# Patient Record
Sex: Male | Born: 1975
Health system: Southern US, Community
[De-identification: ages and names within clinical notes are randomized; demographics above are authoritative.]

## PROBLEM LIST (undated history)

## (undated) DIAGNOSIS — K219 Gastro-esophageal reflux disease without esophagitis: Secondary | ICD-10-CM

## (undated) HISTORY — DX: Gastro-esophageal reflux disease without esophagitis: K21.9

## (undated) HISTORY — PX: MANDIBLE SURGERY: SHX707

---

## 2005-01-31 ENCOUNTER — Emergency Department (HOSPITAL_COMMUNITY): Admission: EM | Admit: 2005-01-31 | Discharge: 2005-01-31 | Payer: Self-pay | Admitting: Emergency Medicine

## 2005-02-01 ENCOUNTER — Ambulatory Visit (HOSPITAL_COMMUNITY): Admission: RE | Admit: 2005-02-01 | Discharge: 2005-02-02 | Payer: Self-pay | Admitting: Otolaryngology

## 2005-03-04 ENCOUNTER — Ambulatory Visit (HOSPITAL_COMMUNITY): Admission: RE | Admit: 2005-03-04 | Discharge: 2005-03-04 | Payer: Self-pay | Admitting: Otolaryngology

## 2005-03-05 ENCOUNTER — Ambulatory Visit (HOSPITAL_BASED_OUTPATIENT_CLINIC_OR_DEPARTMENT_OTHER): Admission: RE | Admit: 2005-03-05 | Discharge: 2005-03-05 | Payer: Self-pay | Admitting: Otolaryngology

## 2015-05-19 DIAGNOSIS — M25561 Pain in right knee: Secondary | ICD-10-CM

## 2015-05-19 HISTORY — DX: Pain in right knee: M25.561

## 2015-06-16 DIAGNOSIS — S83411A Sprain of medial collateral ligament of right knee, initial encounter: Secondary | ICD-10-CM

## 2015-06-16 HISTORY — DX: Sprain of medial collateral ligament of right knee, initial encounter: S83.411A

## 2017-06-07 ENCOUNTER — Emergency Department (HOSPITAL_COMMUNITY): Payer: 59

## 2017-06-07 ENCOUNTER — Encounter (HOSPITAL_COMMUNITY): Payer: Self-pay

## 2017-06-07 DIAGNOSIS — R079 Chest pain, unspecified: Secondary | ICD-10-CM | POA: Insufficient documentation

## 2017-06-07 LAB — CBC
HCT: 41.2 % (ref 39.0–52.0)
Hemoglobin: 14.3 g/dL (ref 13.0–17.0)
MCH: 31.1 pg (ref 26.0–34.0)
MCHC: 34.7 g/dL (ref 30.0–36.0)
MCV: 89.6 fL (ref 78.0–100.0)
Platelets: 175 10*3/uL (ref 150–400)
RBC: 4.6 MIL/uL (ref 4.22–5.81)
RDW: 12.4 % (ref 11.5–15.5)
WBC: 5.3 10*3/uL (ref 4.0–10.5)

## 2017-06-07 LAB — I-STAT TROPONIN, ED: Troponin i, poc: 0 ng/mL (ref 0.00–0.08)

## 2017-06-07 LAB — BASIC METABOLIC PANEL
Anion gap: 7 (ref 5–15)
BUN: 17 mg/dL (ref 6–20)
CO2: 26 mmol/L (ref 22–32)
Calcium: 9.4 mg/dL (ref 8.9–10.3)
Chloride: 103 mmol/L (ref 101–111)
Creatinine, Ser: 1.23 mg/dL (ref 0.61–1.24)
GFR calc Af Amer: >60 mL/min (ref >60)
GFR calc non Af Amer: >60 mL/min (ref >60)
Glucose, Bld: 98 mg/dL (ref 65–99)
Potassium: 4.1 mmol/L (ref 3.5–5.1)
Sodium: 136 mmol/L (ref 135–145)

## 2017-06-07 NOTE — ED Triage Notes (Signed)
Pt reports central chest pain, described as "indigestion" that started 2 days ago associated with weakness and "I just don't feel normal."

## 2017-06-08 ENCOUNTER — Emergency Department (HOSPITAL_COMMUNITY)
Admission: EM | Admit: 2017-06-08 | Discharge: 2017-06-08 | Disposition: A | Discharge status: Home / Self Care | Payer: 59 | Attending: Emergency Medicine | Admitting: Emergency Medicine

## 2017-06-08 DIAGNOSIS — R079 Chest pain, unspecified: Secondary | ICD-10-CM

## 2017-06-08 LAB — I-STAT TROPONIN, ED: Troponin i, poc: 0 ng/mL (ref 0.00–0.08)

## 2017-06-08 MED ORDER — PANTOPRAZOLE SODIUM 20 MG PO TBEC: 20.0000 mg | DELAYED_RELEASE_TABLET | Freq: Every day | ORAL | 2 refills | Status: DC

## 2017-06-08 NOTE — ED Provider Notes (Signed)
[facility]-EMERGENCY DEPT Provider Note   CSN: [id number] Arrival date & time: 06/07/17  2121  By signing my name below, I, [name], attest that this documentation has been prepared under the direction and in the presence of Augie Vane, Canary Brim, *. Electronically Signed: [name], ED Scribe. 06/08/17. 5:34 AM.  History   Chief Complaint Chief Complaint  Patient presents with  . Chest Pain   The history is provided by the patient. No language interpreter was used.    HPI Comments: Chad Bruce is a 41 y.o. male who presents to the Emergency Department complaining of intermittent chest tightness that began a few days ago. He additioally reports feeling "sluggish." He states that he was previously diagnosed with GERD, but has not taken medication because he has not had recent symptoms. The pt has a FHx of MI, and had CABG. The pt denies a h/x of heart problems including MI, CAD, and states that he has "borderline" HLD. He denies a h/x of smoking and DM. The pt denies fever   History reviewed. No pertinent past medical history.  There are no active problems to display for this patient.   History reviewed. No pertinent surgical history.     Home Medications    Prior to Admission medications   Medication Sig Start Date End Date Taking? Authorizing Provider  pantoprazole (PROTONIX) 20 MG tablet Take 1 tablet (20 mg total) by mouth daily. 06/08/17   Gilda Crease, MD    Family History No family history on file.  Social History Social History  Substance Use Topics  . Smoking status: Never Smoker  . Smokeless tobacco: Never Used  . Alcohol use Yes     Allergies   Patient has no allergy information on record.   Review of Systems Review of Systems  Respiratory: Positive for chest tightness.   Cardiovascular: Positive for chest pain.     Physical Exam Updated Vital Signs BP 116/82 (BP Location: Left Arm)   Pulse (!) 52   Temp 98.1 F (36.7  C) (Oral)   Resp 18   SpO2 100%   Physical Exam  Constitutional: He is oriented to person, place, and time. He appears well-developed and well-nourished. No distress.  HENT:  Head: Normocephalic and atraumatic.  Right Ear: Hearing normal.  Left Ear: Hearing normal.  Nose: Nose normal.  Mouth/Throat: Oropharynx is clear and moist and mucous membranes are normal.  Eyes: Conjunctivae and EOM are normal. Pupils are equal, round, and reactive to light.  Neck: Normal range of motion. Neck supple.  Cardiovascular: Regular rhythm, S1 normal and S2 normal.  Exam reveals no gallop and no friction rub.   No murmur heard. Pulmonary/Chest: Effort normal and breath sounds normal. No respiratory distress. He exhibits no tenderness.  Abdominal: Soft. Normal appearance and bowel sounds are normal. There is no hepatosplenomegaly. There is no tenderness. There is no rebound, no guarding, no tenderness at McBurney's point and negative Murphy's sign. No hernia.  Musculoskeletal: Normal range of motion.  Neurological: He is alert and oriented to person, place, and time. He has normal strength. No cranial nerve deficit or sensory deficit. Coordination normal. GCS eye subscore is 4. GCS verbal subscore is 5. GCS motor subscore is 6.  Skin: Skin is warm, dry and intact. No rash noted. No cyanosis.  Psychiatric: He has a normal mood and affect. His speech is normal and behavior is normal. Thought content normal.  Nursing note and vitals reviewed.    ED Treatments /  Results   DIAGNOSTIC STUDIES: Oxygen Saturation is 100% on RA, normal by my interpretation.   COORDINATION OF CARE: 3:32 AM-Discussed next steps with pt. Pt verbalized understanding and is agreeable with the plan.   Labs (all labs ordered are listed, but only abnormal results are displayed) Labs Reviewed  BASIC METABOLIC PANEL  CBC  I-STAT TROPOININ, ED  I-STAT TROPOININ, ED    EKG  EKG Interpretation  Date/Time:  Tuesday June 07 2017 22:06:02 EDT Ventricular Rate:  61 PR Interval:  160 QRS Duration: 86 QT Interval:  378 QTC Calculation: 380 R Axis:   79 Text Interpretation:  Normal sinus rhythm Normal ECG Confirmed by Gilda CreasePollina, Lilias Lorensen J [phone]) on 06/08/2017 3:21:27 AM       Radiology Dg Chest 2 View  Result Date: 06/07/2017 CLINICAL DATA:  Indigestion EXAM: CHEST  2 VIEW COMPARISON:  Prior exam from 08/03/2011 is not available for direct comparison. The report is likewise not available on PACs at the time of this dictation. FINDINGS: The heart size and mediastinal contours are within normal limits. Both lungs are clear. The visualized skeletal structures are unremarkable. IMPRESSION: No active cardiopulmonary disease. Electronically Signed   By: Tollie Ethavid  Kwon M.D.   On: 06/07/2017 23:49    Procedures Procedures (including critical care time)  Medications Ordered in ED Medications - No data to display   Initial Impression / Assessment and Plan / ED Course  I have reviewed the triage vital signs and the nursing notes.  Pertinent labs & imaging results that were available during my care of the patient were reviewed by me and considered in my medical decision making (see chart for details).     Patient presents to the emergency department for evaluation of chest pain. He has been having intermittent pains in the center of his chest for 2 days. No associated shortness of breath. Patient does not have any cardiac history. His only cardiac risk factor is early heart disease in his father. He is felt to be very low risk for cardiac etiology. Patient has a normal EKG. Troponin negative 2. Will be treated for possible GI causes of his discomfort, outpatient follow-up with primary care. Return if symptoms worsen.  Final Clinical Impressions(s) / ED Diagnoses   Final diagnoses:  Chest pain, unspecified type    New Prescriptions New Prescriptions   PANTOPRAZOLE (PROTONIX) 20 MG TABLET    Take 1 tablet (20 mg  total) by mouth daily.   I personally performed the services described in this documentation, which was scribed in my presence. The recorded information has been reviewed and is accurate.     Gilda CreasePollina, Shadara Lopez J, MD 06/08/17 [phone]

## 2018-01-16 DIAGNOSIS — Z6828 Body mass index (BMI) 28.0-28.9, adult: Secondary | ICD-10-CM | POA: Diagnosis not present

## 2018-01-16 DIAGNOSIS — J329 Chronic sinusitis, unspecified: Secondary | ICD-10-CM | POA: Diagnosis not present

## 2018-02-07 DIAGNOSIS — L259 Unspecified contact dermatitis, unspecified cause: Secondary | ICD-10-CM | POA: Diagnosis not present

## 2018-03-19 ENCOUNTER — Other Ambulatory Visit: Payer: Self-pay

## 2018-03-19 ENCOUNTER — Emergency Department (HOSPITAL_COMMUNITY): Payer: BLUE CROSS/BLUE SHIELD

## 2018-03-19 ENCOUNTER — Encounter (HOSPITAL_COMMUNITY): Payer: Self-pay | Admitting: Emergency Medicine

## 2018-03-19 ENCOUNTER — Emergency Department (HOSPITAL_COMMUNITY)
Admission: EM | Admit: 2018-03-19 | Discharge: 2018-03-19 | Disposition: A | Discharge status: Home / Self Care | Payer: BLUE CROSS/BLUE SHIELD | Attending: Emergency Medicine | Admitting: Emergency Medicine

## 2018-03-19 DIAGNOSIS — R0789 Other chest pain: Secondary | ICD-10-CM | POA: Diagnosis not present

## 2018-03-19 DIAGNOSIS — Z79899 Other long term (current) drug therapy: Secondary | ICD-10-CM | POA: Diagnosis not present

## 2018-03-19 DIAGNOSIS — Y929 Unspecified place or not applicable: Secondary | ICD-10-CM | POA: Diagnosis not present

## 2018-03-19 DIAGNOSIS — X509XXA Other and unspecified overexertion or strenuous movements or postures, initial encounter: Secondary | ICD-10-CM | POA: Insufficient documentation

## 2018-03-19 DIAGNOSIS — Y999 Unspecified external cause status: Secondary | ICD-10-CM | POA: Insufficient documentation

## 2018-03-19 DIAGNOSIS — S299XXA Unspecified injury of thorax, initial encounter: Secondary | ICD-10-CM | POA: Diagnosis present

## 2018-03-19 DIAGNOSIS — S29012A Strain of muscle and tendon of back wall of thorax, initial encounter: Secondary | ICD-10-CM | POA: Insufficient documentation

## 2018-03-19 DIAGNOSIS — Y939 Activity, unspecified: Secondary | ICD-10-CM | POA: Diagnosis not present

## 2018-03-19 DIAGNOSIS — R079 Chest pain, unspecified: Secondary | ICD-10-CM | POA: Diagnosis not present

## 2018-03-19 LAB — BASIC METABOLIC PANEL
Anion gap: 7 (ref 5–15)
BUN: 18 mg/dL (ref 6–20)
CO2: 27 mmol/L (ref 22–32)
Calcium: 9.4 mg/dL (ref 8.9–10.3)
Chloride: 103 mmol/L (ref 101–111)
Creatinine, Ser: 1.13 mg/dL (ref 0.61–1.24)
GFR calc Af Amer: >60 mL/min (ref >60)
GFR calc non Af Amer: >60 mL/min (ref >60)
Glucose, Bld: 101 mg/dL — ABNORMAL HIGH (ref 65–99)
Potassium: 4.5 mmol/L (ref 3.5–5.1)
Sodium: 137 mmol/L (ref 135–145)

## 2018-03-19 LAB — HEPATIC FUNCTION PANEL
ALT: 26 U/L (ref 17–63)
AST: 24 U/L (ref 15–41)
Albumin: 4 g/dL (ref 3.5–5.0)
Alkaline Phosphatase: 60 U/L (ref 38–126)
Bilirubin, Direct: 0.2 mg/dL (ref 0.1–0.5)
Indirect Bilirubin: 1.5 mg/dL — ABNORMAL HIGH (ref 0.3–0.9)
Total Bilirubin: 1.7 mg/dL — ABNORMAL HIGH (ref 0.3–1.2)
Total Protein: 7.1 g/dL (ref 6.5–8.1)

## 2018-03-19 LAB — I-STAT TROPONIN, ED
Troponin i, poc: 0 ng/mL (ref 0.00–0.08)
Troponin i, poc: 0 ng/mL (ref 0.00–0.08)

## 2018-03-19 LAB — CBC
HCT: 41.6 % (ref 39.0–52.0)
Hemoglobin: 14.7 g/dL (ref 13.0–17.0)
MCH: 31.5 pg (ref 26.0–34.0)
MCHC: 35.3 g/dL (ref 30.0–36.0)
MCV: 89.3 fL (ref 78.0–100.0)
Platelets: 175 10*3/uL (ref 150–400)
RBC: 4.66 MIL/uL (ref 4.22–5.81)
RDW: 12.5 % (ref 11.5–15.5)
WBC: 5 10*3/uL (ref 4.0–10.5)

## 2018-03-19 LAB — D-DIMER, QUANTITATIVE: D-Dimer, Quant: <0.27 ug/mL-FEU (ref 0.00–0.50)

## 2018-03-19 LAB — LIPASE, BLOOD: Lipase: 27 U/L (ref 11–51)

## 2018-03-19 MED ORDER — NAPROXEN 500 MG PO TABS: 500.0000 mg | ORAL_TABLET | Freq: Two times a day (BID) | ORAL | 0 refills | Status: DC

## 2018-03-19 MED ORDER — IBUPROFEN 800 MG PO TABS
800.0000 mg | ORAL_TABLET | Freq: Once | ORAL | Status: AC
  Administered 2018-03-19: 800 mg via ORAL
  Filled 2018-03-19: qty 1

## 2018-03-19 NOTE — Discharge Instructions (Addendum)
There is no evidence of heart attack or blood clot in the lung.  Follow-up with your doctor.  Take the anti-inflammatories as prescribed and limit your lifting.  Return to the ED if you develop new or worsening symptoms.

## 2018-03-19 NOTE — ED Triage Notes (Signed)
Pt began having intermittent chest pain that radiates to left back.  Tonight began more consistently.  Substernal.  Sharp in nature.  Constant at this time. No shortness of breath.

## 2018-03-19 NOTE — ED Provider Notes (Signed)
[facility]   CSN: [id number] Arrival date & time: 03/19/18  [id number]     History   Chief Complaint Chief Complaint  Patient presents with  . Chest Pain    HPI Chad Bruce is a 42 y.o. male.  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  Patient states he lifted a ladder but nothing out of the ordinary for him.  Denies any specific injury.  Noticed soreness in his upper back and chest after work that progressively worsened overnight and intense this morning.  He did not try to take anything for it at home.  He denies any shortness of breath, nausea, vomiting, diaphoresis.  The pain is worse when he moves his head, neck and torso.  He denies any cardiac history.  He was seen in July 2018 for chest pain but he states this is more of an indigestion.  He has not had pain like this before.  He denies any leg pain or leg swelling.  No alcohol or drug use.  The history is provided by the patient.  Chest Pain   Associated symptoms include back pain. Pertinent negatives include no abdominal pain, no dizziness, no fever, no headaches, no nausea, no shortness of breath, no vomiting and no weakness.    History reviewed. No pertinent past medical history.  There are no active problems to display for this patient.   History reviewed. No pertinent surgical history.      Home Medications    Prior to Admission medications   Medication Sig Start Date End Date Taking? Authorizing Provider  pantoprazole (PROTONIX) 20 MG tablet Take 1 tablet (20 mg total) by mouth daily. 06/08/17   Gilda CreasePollina, Christopher J, MD    Family History No family history on file.  Social History Social History   Tobacco Use  . Smoking status: Never Smoker  . Smokeless tobacco: Never Used  Substance Use Topics  . Alcohol use: Not Currently  . Drug use: Never     Allergies   Patient has no  allergy information on record.   Review of Systems Review of Systems  Constitutional: Negative for activity change, appetite change and fever.  Respiratory: Positive for chest tightness. Negative for shortness of breath.   Cardiovascular: Positive for chest pain.  Gastrointestinal: Negative for abdominal pain, nausea and vomiting.  Genitourinary: Negative for dysuria and hematuria.  Musculoskeletal: Positive for arthralgias, back pain and myalgias.  Skin: Negative for rash.  Neurological: Negative for dizziness, weakness and headaches.   all other systems are negative except as noted in the HPI and PMH.     Physical Exam Updated Vital Signs BP 121/82 (BP Location: Right Arm)   Pulse 63   Temp 97.8 F (36.6 C) (Oral)   SpO2 100%   Physical Exam  Constitutional: He is oriented to person, place, and time. He appears well-developed and well-nourished. No distress.  HENT:  Head: Normocephalic and atraumatic.  Mouth/Throat: Oropharynx is clear and moist. No oropharyngeal exudate.  Eyes: Pupils are equal, round, and reactive to light. Conjunctivae and EOM are normal.  Neck: Normal range of motion. Neck supple.  No meningismus.  Cardiovascular: Normal rate, regular rhythm, normal heart sounds and intact distal pulses.  No murmur heard. Equal radial pulses and grip strengths  Pulmonary/Chest: Effort normal and breath sounds normal. No respiratory distress. He exhibits tenderness.  Chest pain worse with palpation and worse  with movement of torso  Abdominal: Soft. There is no tenderness. There is no rebound and no guarding.  Musculoskeletal: Normal range of motion. He exhibits tenderness. He exhibits no edema.  Paraspinal thoracic tenderness bilaterally  Pain worse with movement of shoulders, neck and torso  Neurological: He is alert and oriented to person, place, and time. No cranial nerve deficit. He exhibits normal muscle tone. Coordination normal.  No ataxia on finger to nose  bilaterally. No pronator drift. 5/5 strength throughout. CN 2-12 intact.Equal grip strength. Sensation intact.   Skin: Skin is warm.  Psychiatric: He has a normal mood and affect. His behavior is normal.  Nursing note and vitals reviewed.    ED Treatments / Results  Labs (all labs ordered are listed, but only abnormal results are displayed) Labs Reviewed  BASIC METABOLIC PANEL - Abnormal; Notable for the following components:      Result Value   Glucose, Bld 101 (*)    All other components within normal limits  HEPATIC FUNCTION PANEL - Abnormal; Notable for the following components:   Total Bilirubin 1.7 (*)    Indirect Bilirubin 1.5 (*)    All other components within normal limits  CBC  D-DIMER, QUANTITATIVE (NOT AT [facility])  LIPASE, BLOOD  I-STAT TROPONIN, ED  I-STAT TROPONIN, ED  I-STAT TROPONIN, ED  I-STAT TROPONIN, ED    EKG EKG Interpretation  Date/Time:  Sunday March 19 2018 05:03:05 EDT Ventricular Rate:  58 PR Interval:  156 QRS Duration: 82 QT Interval:  391 QTC Calculation: 384 R Axis:   73 Text Interpretation:  Sinus rhythm ST elev, probable normal early repol pattern No significant change was found Confirmed by Glynn Octave [phone]) on 03/19/2018 5:21:10 AM   Radiology Dg Chest 2 View  Result Date: 03/19/2018 CLINICAL DATA:  Chest and upper back pain.  Ex-smoker. EXAM: CHEST - 2 VIEW COMPARISON:  06/07/2017 FINDINGS: The heart size and mediastinal contours are within normal limits. Both lungs are clear. The visualized skeletal structures are unremarkable. IMPRESSION: No active cardiopulmonary disease. Electronically Signed   By: Tollie Eth M.D.   On: 03/19/2018 01:43    Procedures Procedures (including critical care time)  Medications Ordered in ED Medications  ibuprofen (ADVIL,MOTRIN) tablet 800 mg (has no administration in time range)     Initial Impression / Assessment and Plan / ED Course  I have reviewed the triage vital signs and the nursing  notes.  Pertinent labs & imaging results that were available during my care of the patient were reviewed by me and considered in my medical decision making (see chart for details).     Ongoing upper back and chest pain since yesterday.  Worse with movement.  No associated symptoms.  EKG is nonischemic.  Low suspicion for ACS. low suspicion for aortic dissection or pulmonary embolism.  Equal radial pulses and grip strength.  Equal upper extremity blood pressures.    Repeat EKG unchanged and nonischemic.  Troponin negative x2.  D-dimer negative.  Low suspicion for ACS, pulmonary embolism, aortic dissection.  Will treat supportively for likely musculoskeletal chest and upper back pain. Follow-up with PCP.  Return precautions discussed.    Inal Clinical Impressions(s) / ED Diagnoses   Final diagnoses:  Atypical chest pain  Upper back strain, initial encounter    ED Discharge Orders    None       Magic Mohler, Jeannett Senior, MD 03/19/18 [phone]

## 2018-07-26 DIAGNOSIS — S134XXA Sprain of ligaments of cervical spine, initial encounter: Secondary | ICD-10-CM | POA: Diagnosis not present

## 2018-07-26 DIAGNOSIS — M9902 Segmental and somatic dysfunction of thoracic region: Secondary | ICD-10-CM | POA: Diagnosis not present

## 2018-07-26 DIAGNOSIS — M9901 Segmental and somatic dysfunction of cervical region: Secondary | ICD-10-CM | POA: Diagnosis not present

## 2018-07-26 DIAGNOSIS — S161XXA Strain of muscle, fascia and tendon at neck level, initial encounter: Secondary | ICD-10-CM | POA: Diagnosis not present

## 2018-07-27 DIAGNOSIS — M9902 Segmental and somatic dysfunction of thoracic region: Secondary | ICD-10-CM | POA: Diagnosis not present

## 2018-07-27 DIAGNOSIS — S161XXA Strain of muscle, fascia and tendon at neck level, initial encounter: Secondary | ICD-10-CM | POA: Diagnosis not present

## 2018-07-27 DIAGNOSIS — S134XXA Sprain of ligaments of cervical spine, initial encounter: Secondary | ICD-10-CM | POA: Diagnosis not present

## 2018-07-27 DIAGNOSIS — M9901 Segmental and somatic dysfunction of cervical region: Secondary | ICD-10-CM | POA: Diagnosis not present

## 2018-12-11 DIAGNOSIS — Z6828 Body mass index (BMI) 28.0-28.9, adult: Secondary | ICD-10-CM | POA: Diagnosis not present

## 2018-12-11 DIAGNOSIS — M542 Cervicalgia: Secondary | ICD-10-CM | POA: Diagnosis not present

## 2018-12-11 DIAGNOSIS — Z2821 Immunization not carried out because of patient refusal: Secondary | ICD-10-CM | POA: Diagnosis not present

## 2018-12-11 DIAGNOSIS — G8929 Other chronic pain: Secondary | ICD-10-CM | POA: Diagnosis not present

## 2019-03-21 DIAGNOSIS — Z1331 Encounter for screening for depression: Secondary | ICD-10-CM | POA: Diagnosis not present

## 2019-03-21 DIAGNOSIS — Z0000 Encounter for general adult medical examination without abnormal findings: Secondary | ICD-10-CM | POA: Diagnosis not present

## 2019-03-21 DIAGNOSIS — Z6828 Body mass index (BMI) 28.0-28.9, adult: Secondary | ICD-10-CM | POA: Diagnosis not present

## 2019-03-21 DIAGNOSIS — Z13220 Encounter for screening for lipoid disorders: Secondary | ICD-10-CM | POA: Diagnosis not present

## 2019-04-10 DIAGNOSIS — R899 Unspecified abnormal finding in specimens from other organs, systems and tissues: Secondary | ICD-10-CM | POA: Diagnosis not present

## 2019-05-03 DIAGNOSIS — Z20828 Contact with and (suspected) exposure to other viral communicable diseases: Secondary | ICD-10-CM | POA: Diagnosis not present

## 2019-06-19 DIAGNOSIS — J329 Chronic sinusitis, unspecified: Secondary | ICD-10-CM | POA: Diagnosis not present

## 2019-06-19 DIAGNOSIS — Z6827 Body mass index (BMI) 27.0-27.9, adult: Secondary | ICD-10-CM | POA: Diagnosis not present

## 2019-06-27 DIAGNOSIS — J329 Chronic sinusitis, unspecified: Secondary | ICD-10-CM | POA: Insufficient documentation

## 2019-06-27 DIAGNOSIS — J342 Deviated nasal septum: Secondary | ICD-10-CM

## 2019-06-27 DIAGNOSIS — J310 Chronic rhinitis: Secondary | ICD-10-CM | POA: Diagnosis not present

## 2019-06-27 HISTORY — DX: Deviated nasal septum: J34.2

## 2019-06-27 HISTORY — DX: Chronic sinusitis, unspecified: J32.9

## 2019-07-18 ENCOUNTER — Emergency Department (HOSPITAL_COMMUNITY)
Admission: EM | Admit: 2019-07-18 | Discharge: 2019-07-19 | Disposition: A | Discharge status: Home / Self Care | Payer: BC Managed Care – PPO | Attending: Emergency Medicine | Admitting: Emergency Medicine

## 2019-07-18 ENCOUNTER — Emergency Department (HOSPITAL_COMMUNITY): Payer: BC Managed Care – PPO

## 2019-07-18 ENCOUNTER — Other Ambulatory Visit: Payer: Self-pay

## 2019-07-18 DIAGNOSIS — R1013 Epigastric pain: Secondary | ICD-10-CM | POA: Diagnosis not present

## 2019-07-18 DIAGNOSIS — K30 Functional dyspepsia: Secondary | ICD-10-CM | POA: Insufficient documentation

## 2019-07-18 DIAGNOSIS — R079 Chest pain, unspecified: Secondary | ICD-10-CM | POA: Diagnosis not present

## 2019-07-18 DIAGNOSIS — R0789 Other chest pain: Secondary | ICD-10-CM | POA: Diagnosis not present

## 2019-07-18 DIAGNOSIS — R001 Bradycardia, unspecified: Secondary | ICD-10-CM | POA: Diagnosis not present

## 2019-07-18 LAB — BASIC METABOLIC PANEL
Anion gap: 9 (ref 5–15)
BUN: 16 mg/dL (ref 6–20)
CO2: 25 mmol/L (ref 22–32)
Calcium: 9.4 mg/dL (ref 8.9–10.3)
Chloride: 102 mmol/L (ref 98–111)
Creatinine, Ser: 1.11 mg/dL (ref 0.61–1.24)
GFR calc Af Amer: >60 mL/min (ref >60)
GFR calc non Af Amer: >60 mL/min (ref >60)
Glucose, Bld: 101 mg/dL — ABNORMAL HIGH (ref 70–99)
Potassium: 4.3 mmol/L (ref 3.5–5.1)
Sodium: 136 mmol/L (ref 135–145)

## 2019-07-18 LAB — CBC
HCT: 43.9 % (ref 39.0–52.0)
Hemoglobin: 15.4 g/dL (ref 13.0–17.0)
MCH: 31.4 pg (ref 26.0–34.0)
MCHC: 35.1 g/dL (ref 30.0–36.0)
MCV: 89.4 fL (ref 80.0–100.0)
Platelets: 212 10*3/uL (ref 150–400)
RBC: 4.91 MIL/uL (ref 4.22–5.81)
RDW: 12 % (ref 11.5–15.5)
WBC: 5.3 10*3/uL (ref 4.0–10.5)
nRBC: 0 % (ref 0.0–0.2)

## 2019-07-18 LAB — TROPONIN I (HIGH SENSITIVITY): Troponin I (High Sensitivity): <2 ng/L (ref <18)

## 2019-07-18 NOTE — ED Notes (Signed)
Pt. Not answering for vitals reasses or troponin blood draw x2.

## 2019-07-18 NOTE — ED Triage Notes (Signed)
Pt arrives POV for eval of chest pressure x 2-3 days. Pt states associated throat scratchiness. Pt reports is currently getting augmentin for a known sinus infection. Pt denies sweating/diaphoresis/N/V.

## 2019-07-18 NOTE — ED Notes (Signed)
Called x2 for delta trop with no answer

## 2019-07-19 LAB — TROPONIN I (HIGH SENSITIVITY): Troponin I (High Sensitivity): <2 ng/L (ref <18)

## 2019-07-19 MED ORDER — ALUM & MAG HYDROXIDE-SIMETH 200-200-20 MG/5ML PO SUSP
30.0000 mL | Freq: Once | ORAL | Status: AC
  Administered 2019-07-19: 30 mL via ORAL
  Filled 2019-07-19: qty 30

## 2019-07-19 MED ORDER — LIDOCAINE VISCOUS HCL 2 % MT SOLN
15.0000 mL | Freq: Once | OROMUCOSAL | Status: AC
  Administered 2019-07-19: 15 mL via ORAL
  Filled 2019-07-19: qty 15

## 2019-07-19 MED ORDER — OMEPRAZOLE 20 MG PO CPDR: 20.0000 mg | DELAYED_RELEASE_CAPSULE | Freq: Every day | ORAL | 0 refills | Status: DC

## 2019-07-19 NOTE — ED Provider Notes (Signed)
Patient seen/examined in the Emergency Department in conjunction with [facility] Practice Provider McDonald Patient reports chest pain/burning.  Denies SOB Exam : awake/alert, no distress, no murmurs noted, no added lung sounds Plan: He is low risk for ACS.  Low suspicion for PE He reports being very active and never has any chest pain or shortness of breath.  He has one isolated EKG changes, but no other acute findings.  Very low suspicion for ACS/PE/dissection.   Ripley Fraise, MD 07/19/19 [phone]

## 2019-07-19 NOTE — ED Notes (Signed)
The pt is c/o mid-chest pain for one week  No sob no dizziness nausea he burped on the way in here tonight the pain went away.  No pain at present

## 2019-07-19 NOTE — ED Provider Notes (Signed)
[place] EMERGENCY DEPARTMENT Provider Note   CSN: [id number] Arrival date & time: 07/18/19  1909    History   Chief Complaint Chief Complaint  Patient presents with  . Chest Pain    HPI Chad Bruce is a 43 y.o. male with no chronic medical conditions who presents to the emergency department with a chief complaint of chest pain.  The patient reports that he has been having episodic chest pain that radiates up to his throat and is characterized as a burning intermittently over the last few weeks.  He reports that the pain last for approximately 30 minutes to an hour before spontaneously resolving.  He reports associated burping and belching. Pain is associated with eating.  He reports that he developed an episode earlier today after eating a cheeseburger and [place] fries for lunch followed by a fruit cup with several acidic fruits.  He denies fever, chills, shortness of breath, dizziness, lightheadedness, palpitations, leg swelling, nausea, vomiting, diarrhea, abdominal pain, hematemesis, melena, hematochezia.  He does not drink alcohol.  He is a non-smoker.  No other IV or recreational drug use.  He reports that he has been treating his symptoms with Tums with some improvement in his symptoms.  He reports that he was seen by his primary care provider previously for the same.  He reports that he was given a prescription, but he never filled it because he thought he could manage the symptoms on his own.  Family history of cardiovascular disease.     The history is provided by the patient. No language interpreter was used.    No past medical history on file.  There are no active problems to display for this patient.   No past surgical history on file.      Home Medications    Prior to Admission medications   Medication Sig Start Date End Date Taking? Authorizing Provider  naproxen (NAPROSYN) 500 MG tablet Take 1 tablet (500 mg total) by mouth 2 (two)  times daily. 03/19/18   Rancour, Annie Main, MD  omeprazole (PRILOSEC) 20 MG capsule Take 1 capsule (20 mg total) by mouth daily. 07/19/19   Kory Rains A, PA-C    Family History No family history on file.  Social History Social History   Tobacco Use  . Smoking status: Never Smoker  . Smokeless tobacco: Never Used  Substance Use Topics  . Alcohol use: Not Currently  . Drug use: Never     Allergies   Patient has no known allergies.   Review of Systems Review of Systems  Constitutional: Negative for appetite change, chills and fever.  HENT: Negative for congestion, sinus pain and sore throat.   Eyes: Positive for visual disturbance.  Respiratory: Negative for shortness of breath and wheezing.   Cardiovascular: Positive for chest pain. Negative for palpitations and leg swelling.  Gastrointestinal: Negative for abdominal pain, blood in stool, constipation, nausea and vomiting.  Genitourinary: Negative for dysuria, penile pain and urgency.  Musculoskeletal: Negative for back pain, myalgias and neck pain.  Skin: Negative for rash.  Allergic/Immunologic: Negative for immunocompromised state.  Neurological: Negative for dizziness, seizures, weakness and headaches.  Psychiatric/Behavioral: Negative for confusion.     Physical Exam Updated Vital Signs BP 106/73   Pulse (!) 46   Temp 98.1 F (36.7 C) (Oral)   Resp 12   Ht 5\' 10"  (1.778 m)   Wt 87.1 kg   SpO2 98%   BMI 27.55 kg/m   Physical Exam Vitals  signs and nursing note reviewed.  Constitutional:      General: He is not in acute distress.    Appearance: Normal appearance. He is well-developed. He is not ill-appearing, toxic-appearing or diaphoretic.  HENT:     Head: Normocephalic.     Mouth/Throat:     Pharynx: No oropharyngeal exudate or posterior oropharyngeal erythema.  Eyes:     General: No scleral icterus.    Conjunctiva/sclera: Conjunctivae normal.  Neck:     Musculoskeletal: Neck supple.   Cardiovascular:     Rate and Rhythm: Regular rhythm. Bradycardia present.     Pulses: Normal pulses.     Heart sounds: Normal heart sounds. No murmur. No friction rub. No gallop.   Pulmonary:     Effort: Pulmonary effort is normal. No respiratory distress.     Breath sounds: No stridor. No wheezing, rhonchi or rales.  Chest:     Chest wall: No tenderness.  Abdominal:     General: There is no distension.     Palpations: Abdomen is soft. There is no mass.     Tenderness: There is no abdominal tenderness. There is no right CVA tenderness, left CVA tenderness, guarding or rebound.     Hernia: No hernia is present.     Comments: Abdomen is soft, nontender, nondistended.  Musculoskeletal:     Right lower leg: No edema.     Left lower leg: No edema.  Skin:    General: Skin is warm and dry.     Capillary Refill: Capillary refill takes less than 2 seconds.  Neurological:     General: No focal deficit present.     Mental Status: He is alert.  Psychiatric:        Behavior: Behavior normal.      ED Treatments / Results  Labs (all labs ordered are listed, but only abnormal results are displayed) Labs Reviewed  BASIC METABOLIC PANEL - Abnormal; Notable for the following components:      Result Value   Glucose, Bld 101 (*)    All other components within normal limits  CBC  TROPONIN I (HIGH SENSITIVITY)  TROPONIN I (HIGH SENSITIVITY)    EKG EKG Interpretation  Date/Time:  Wednesday July 18 2019 19:17:09 EDT Ventricular Rate:  67 PR Interval:  154 QRS Duration: 82 QT Interval:  358 QTC Calculation: 378 R Axis:   85 Text Interpretation:  Normal sinus rhythm Nonspecific ST and T wave abnormality Abnormal ECG Confirmed by Zadie RhineWickline, Donald ([id number]) on 07/19/2019 1:29:29 AM   Radiology Dg Chest 2 View  Result Date: 07/18/2019 CLINICAL DATA:  Chest pain EXAM: CHEST - 2 VIEW COMPARISON:  March 19, 2018 FINDINGS: The heart size and mediastinal contours are within normal limits.  Both lungs are clear. The visualized skeletal structures are unremarkable. IMPRESSION: No acute cardiopulmonary process Electronically Signed   By: Jonna ClarkBindu  Avutu M.D.   On: 07/18/2019 20:50    Procedures Procedures (including critical care time)  Medications Ordered in ED Medications  alum & mag hydroxide-simeth (MAALOX/MYLANTA) 200-200-20 MG/5ML suspension 30 mL (30 mLs Oral Given 07/19/19 0221)    And  lidocaine (XYLOCAINE) 2 % viscous mouth solution 15 mL (15 mLs Oral Given 07/19/19 0221)     Initial Impression / Assessment and Plan / ED Course  I have reviewed the triage vital signs and the nursing notes.  Pertinent labs & imaging results that were available during my care of the patient were reviewed by me and considered in my medical decision making (  see chart for details).        43 year old male with no pertinent past medical history presenting with burning chest pain that radiates to the throat that is been intermittently occurring for the last few weeks.  He has been taking Tums with significant improvement in his symptoms.  He has minimally bradycardic, but suspect this is secondary to physical conditioning and he is asymptomatic.  EKG with nonspecific T wave abnormalities from previous.  Delta troponin is unchanged. Hear score is 1.  Labs are otherwise reassuring. Presentation is very atypical for ACS.  He is PERC negative.  Doubt esophageal rupture, peptic ulcer disease, tension pneumothorax, PE, or cardiac tamponade.  Patient was given GI cocktail with significant improvement in his symptoms.  Patient was seen and independently evaluated by Dr. Bebe ShaggyWickline, attending physician.  We will discharge the patient with omeprazole and close follow-up with primary care.  Strict return precautions given.  He is hemodynamically stable and in no acute distress.  The patient was safe for discharge with outpatient follow-up at this time.  Final Clinical Impressions(s) / ED Diagnoses   Final  diagnoses:  Dyspepsia    ED Discharge Orders         Ordered    omeprazole (PRILOSEC) 20 MG capsule  Daily     07/19/19 0305           Barkley BoardsMcDonald, Sujey Gundry A, PA-C 07/19/19 0505    Zadie RhineWickline, Donald, MD 07/19/19 [phone]

## 2019-07-19 NOTE — ED Notes (Signed)
To mri 

## 2019-07-19 NOTE — Discharge Instructions (Signed)
Thank you for allowing me to care for you today in the Emergency Department.   Keep your follow-up appointment with primary care.  Take 1 tablet of omeprazole by mouth daily.  You can continue to take Tums as directed on the label as needed. Maalox is also available over the counter and can be taken as needed for your symptoms.   Return to the emergency department if you develop black, tarry stools, persistent vomiting, chest pain and shortness of breath that is worse with activity and does not resolve, or other new, concerning symptoms.

## 2019-07-31 DIAGNOSIS — J329 Chronic sinusitis, unspecified: Secondary | ICD-10-CM | POA: Diagnosis not present

## 2019-08-23 DIAGNOSIS — Z2821 Immunization not carried out because of patient refusal: Secondary | ICD-10-CM | POA: Diagnosis not present

## 2019-08-23 DIAGNOSIS — Z6827 Body mass index (BMI) 27.0-27.9, adult: Secondary | ICD-10-CM | POA: Diagnosis not present

## 2019-08-23 DIAGNOSIS — M25571 Pain in right ankle and joints of right foot: Secondary | ICD-10-CM | POA: Diagnosis not present

## 2019-09-24 ENCOUNTER — Encounter (HOSPITAL_COMMUNITY): Payer: Self-pay

## 2019-09-24 ENCOUNTER — Emergency Department (HOSPITAL_COMMUNITY): Payer: BC Managed Care – PPO

## 2019-09-24 ENCOUNTER — Other Ambulatory Visit: Payer: Self-pay

## 2019-09-24 ENCOUNTER — Emergency Department (HOSPITAL_COMMUNITY)
Admission: EM | Admit: 2019-09-24 | Discharge: 2019-09-24 | Disposition: A | Discharge status: Home / Self Care | Payer: BC Managed Care – PPO | Attending: Emergency Medicine | Admitting: Emergency Medicine

## 2019-09-24 DIAGNOSIS — R079 Chest pain, unspecified: Secondary | ICD-10-CM

## 2019-09-24 DIAGNOSIS — X58XXXA Exposure to other specified factors, initial encounter: Secondary | ICD-10-CM | POA: Diagnosis not present

## 2019-09-24 DIAGNOSIS — S29011A Strain of muscle and tendon of front wall of thorax, initial encounter: Secondary | ICD-10-CM | POA: Diagnosis not present

## 2019-09-24 DIAGNOSIS — S299XXA Unspecified injury of thorax, initial encounter: Secondary | ICD-10-CM | POA: Diagnosis not present

## 2019-09-24 DIAGNOSIS — Y998 Other external cause status: Secondary | ICD-10-CM | POA: Diagnosis not present

## 2019-09-24 DIAGNOSIS — Z79899 Other long term (current) drug therapy: Secondary | ICD-10-CM | POA: Diagnosis not present

## 2019-09-24 DIAGNOSIS — Y92017 Garden or yard in single-family (private) house as the place of occurrence of the external cause: Secondary | ICD-10-CM | POA: Insufficient documentation

## 2019-09-24 DIAGNOSIS — Y93H2 Activity, gardening and landscaping: Secondary | ICD-10-CM | POA: Insufficient documentation

## 2019-09-24 LAB — CBC
HCT: 44.3 % (ref 39.0–52.0)
Hemoglobin: 15.8 g/dL (ref 13.0–17.0)
MCH: 32 pg (ref 26.0–34.0)
MCHC: 35.7 g/dL (ref 30.0–36.0)
MCV: 89.9 fL (ref 80.0–100.0)
Platelets: 217 10*3/uL (ref 150–400)
RBC: 4.93 MIL/uL (ref 4.22–5.81)
RDW: 11.9 % (ref 11.5–15.5)
WBC: 5.2 10*3/uL (ref 4.0–10.5)
nRBC: 0 % (ref 0.0–0.2)

## 2019-09-24 LAB — BASIC METABOLIC PANEL
Anion gap: 9 (ref 5–15)
BUN: 13 mg/dL (ref 6–20)
CO2: 27 mmol/L (ref 22–32)
Calcium: 9.8 mg/dL (ref 8.9–10.3)
Chloride: 101 mmol/L (ref 98–111)
Creatinine, Ser: 1.2 mg/dL (ref 0.61–1.24)
GFR calc Af Amer: >60 mL/min (ref >60)
GFR calc non Af Amer: >60 mL/min (ref >60)
Glucose, Bld: 93 mg/dL (ref 70–99)
Potassium: 4.6 mmol/L (ref 3.5–5.1)
Sodium: 137 mmol/L (ref 135–145)

## 2019-09-24 LAB — TROPONIN I (HIGH SENSITIVITY): Troponin I (High Sensitivity): <2 ng/L (ref <18)

## 2019-09-24 MED ORDER — SODIUM CHLORIDE 0.9% FLUSH: 3.0000 mL | Freq: Once | INTRAVENOUS | Status: DC

## 2019-09-24 NOTE — ED Triage Notes (Signed)
Pt reports left sided chest pain since last night, no other symptoms. Pain is worse with movement. Pt a.o, nad noted

## 2019-09-24 NOTE — Discharge Instructions (Addendum)
Recommend following up with your primary doctor to discuss the symptoms you are having today.  Recommend taking anti-inflammatory such as ibuprofen or naproxen as needed for pain control.  If you develop recurrent chest pain, difficulty breathing or other new concerning symptoms recommend returning to ER for reassessment.

## 2019-09-24 NOTE — ED Provider Notes (Signed)
[facility] Provider Note   CSN: [id number] Arrival date & time: 09/24/19  1547     History   Chief Complaint Chief Complaint  Patient presents with  . Chest Pain    HPI Chad Bruce is a 43 y.o. male.  Presents ER with chest pain.  Patient states yesterday afternoon/evening he was working in the yard, after coming into the house he noted a left-sided chest pain.  States it was sharp, worsened with moving his left arm.  Went to bed still having the chest pain.  Noticed a little bit this morning, but throughout the day, it has eased off.  Currently not having any chest pain.  Currently having no symptoms at all.  Since yesterday he has not had any shortness of breath, no cough, no fever, no abdominal pain, no nausea or vomiting.  Patient states in his 52s he smoked some cigarettes, but has not smoked in over 13 years.  He has no other medical problems.  States his father had a heart attack in his late 68s.     HPI  History reviewed. No pertinent past medical history.  There are no active problems to display for this patient.   History reviewed. No pertinent surgical history.      Home Medications    Prior to Admission medications   Medication Sig Start Date End Date Taking? Authorizing Provider  naproxen (NAPROSYN) 500 MG tablet Take 1 tablet (500 mg total) by mouth 2 (two) times daily. 03/19/18   Rancour, Annie Main, MD  omeprazole (PRILOSEC) 20 MG capsule Take 1 capsule (20 mg total) by mouth daily. 07/19/19   McDonald, Mia A, PA-C    Family History No family history on file.  Social History Social History   Tobacco Use  . Smoking status: Never Smoker  . Smokeless tobacco: Never Used  Substance Use Topics  . Alcohol use: Not Currently  . Drug use: Never     Allergies   Patient has no known allergies.   Review of Systems Review of Systems  Constitutional: Negative for chills and fever.  HENT: Negative for ear pain  and sore throat.   Eyes: Negative for pain and visual disturbance.  Respiratory: Negative for cough and shortness of breath.   Cardiovascular: Positive for chest pain. Negative for palpitations.  Gastrointestinal: Negative for abdominal pain and vomiting.  Genitourinary: Negative for dysuria and hematuria.  Musculoskeletal: Negative for arthralgias and back pain.  Skin: Negative for color change and rash.  Neurological: Negative for seizures and syncope.  All other systems reviewed and are negative.    Physical Exam Updated Vital Signs BP 120/74 (BP Location: Left Arm)   Pulse 79   Temp 98.5 F (36.9 C) (Oral)   Resp 14   SpO2 100%   Physical Exam Vitals signs and nursing note reviewed.  Constitutional:      Appearance: He is well-developed.  HENT:     Head: Normocephalic and atraumatic.  Eyes:     Conjunctiva/sclera: Conjunctivae normal.  Neck:     Musculoskeletal: Neck supple.  Cardiovascular:     Rate and Rhythm: Normal rate and regular rhythm.     Heart sounds: Normal heart sounds. No murmur.  Pulmonary:     Effort: Pulmonary effort is normal. No respiratory distress.     Breath sounds: Normal breath sounds.  Chest:     Chest wall: No mass or deformity.  Abdominal:     Palpations: Abdomen is soft.  Tenderness: There is no abdominal tenderness.  Musculoskeletal:     Right lower leg: No edema.     Left lower leg: No edema.  Skin:    General: Skin is warm and dry.     Capillary Refill: Capillary refill takes less than 2 seconds.  Neurological:     General: No focal deficit present.     Mental Status: He is alert.      ED Treatments / Results  Labs (all labs ordered are listed, but only abnormal results are displayed) Labs Reviewed  BASIC METABOLIC PANEL  CBC  TROPONIN I (HIGH SENSITIVITY)  TROPONIN I (HIGH SENSITIVITY)    EKG EKG Interpretation  Date/Time:  Monday September 24 2019 15:52:11 EDT Ventricular Rate:  78 PR Interval:  154 QRS  Duration: 80 QT Interval:  344 QTC Calculation: 392 R Axis:   81 Text Interpretation:  Normal sinus rhythm with sinus arrhythmia Normal ECG Confirmed by Marianna Fuss ([id number]) on 09/24/2019 5:06:16 PM   Radiology Dg Chest 2 View  Result Date: 09/24/2019 CLINICAL DATA:  Chest pain EXAM: CHEST - 2 VIEW COMPARISON:  07/18/2019 FINDINGS: The heart size and mediastinal contours are within normal limits. Both lungs are clear. The visualized skeletal structures are unremarkable. IMPRESSION: No active cardiopulmonary disease. Electronically Signed   By: Jasmine Pang M.D.   On: 09/24/2019 16:27    Procedures Procedures (including critical care time)  Medications Ordered in ED Medications  sodium chloride flush (NS) 0.9 % injection 3 mL (has no administration in time range)     Initial Impression / Assessment and Plan / ED Course  I have reviewed the triage vital signs and the nursing notes.  Pertinent labs & imaging results that were available during my care of the patient were reviewed by me and considered in my medical decision making (see chart for details).        43 year old male presents after he had an episode of chest pain.  EKG without acute ischemic changes, high-sensitivity troponin undetectable, chest x-ray negative for acute pathology.  Labs otherwise unremarkable.  Given these findings, and patient description of symptoms, very low suspicion for ACS.  Suspect more likely muscle strain particularly given the positional nature of the pain.  Given above work-up and he remains symptom-free, believe he is appropriate for discharge and outpatient management this time.  Recommend recheck with his primary doctor.    After the discussed management above, the patient was determined to be safe for discharge.  The patient was in agreement with this plan and all questions regarding their care were answered.  ED return precautions were discussed and the patient will return to the ED  with any significant worsening of condition.    Final Clinical Impressions(s) / ED Diagnoses   Final diagnoses:  Chest pain, unspecified type  Muscle strain of chest wall, initial encounter    ED Discharge Orders    None       Milagros Loll, MD 09/24/19 1733

## 2019-09-27 DIAGNOSIS — M549 Dorsalgia, unspecified: Secondary | ICD-10-CM | POA: Diagnosis not present

## 2019-09-27 DIAGNOSIS — Z6827 Body mass index (BMI) 27.0-27.9, adult: Secondary | ICD-10-CM | POA: Diagnosis not present

## 2019-09-27 DIAGNOSIS — R0789 Other chest pain: Secondary | ICD-10-CM | POA: Diagnosis not present

## 2019-09-28 DIAGNOSIS — S161XXA Strain of muscle, fascia and tendon at neck level, initial encounter: Secondary | ICD-10-CM | POA: Diagnosis not present

## 2019-09-28 DIAGNOSIS — S134XXA Sprain of ligaments of cervical spine, initial encounter: Secondary | ICD-10-CM | POA: Diagnosis not present

## 2019-09-28 DIAGNOSIS — M9901 Segmental and somatic dysfunction of cervical region: Secondary | ICD-10-CM | POA: Diagnosis not present

## 2019-09-28 DIAGNOSIS — M9902 Segmental and somatic dysfunction of thoracic region: Secondary | ICD-10-CM | POA: Diagnosis not present

## 2019-10-01 DIAGNOSIS — M79642 Pain in left hand: Secondary | ICD-10-CM | POA: Diagnosis not present

## 2019-10-01 DIAGNOSIS — M25532 Pain in left wrist: Secondary | ICD-10-CM | POA: Diagnosis not present

## 2019-10-05 DIAGNOSIS — M9901 Segmental and somatic dysfunction of cervical region: Secondary | ICD-10-CM | POA: Diagnosis not present

## 2019-10-05 DIAGNOSIS — S161XXA Strain of muscle, fascia and tendon at neck level, initial encounter: Secondary | ICD-10-CM | POA: Diagnosis not present

## 2019-10-05 DIAGNOSIS — S134XXA Sprain of ligaments of cervical spine, initial encounter: Secondary | ICD-10-CM | POA: Diagnosis not present

## 2019-10-05 DIAGNOSIS — M9902 Segmental and somatic dysfunction of thoracic region: Secondary | ICD-10-CM | POA: Diagnosis not present

## 2019-10-11 ENCOUNTER — Ambulatory Visit (INDEPENDENT_AMBULATORY_CARE_PROVIDER_SITE_OTHER): Payer: BC Managed Care – PPO | Admitting: Gastroenterology

## 2019-10-11 ENCOUNTER — Encounter: Payer: Self-pay | Admitting: Gastroenterology

## 2019-10-11 VITALS — BP 120/70 | HR 76 | Temp 98.4°F | Ht 70.0 in | Wt 188.0 lb

## 2019-10-11 DIAGNOSIS — Z1159 Encounter for screening for other viral diseases: Secondary | ICD-10-CM | POA: Insufficient documentation

## 2019-10-11 DIAGNOSIS — R0789 Other chest pain: Secondary | ICD-10-CM

## 2019-10-11 DIAGNOSIS — K219 Gastro-esophageal reflux disease without esophagitis: Secondary | ICD-10-CM

## 2019-10-11 HISTORY — DX: Other chest pain: R07.89

## 2019-10-11 HISTORY — DX: Encounter for screening for other viral diseases: Z11.59

## 2019-10-11 HISTORY — DX: Gastro-esophageal reflux disease without esophagitis: K21.9

## 2019-10-11 NOTE — Patient Instructions (Signed)
If you are age 41 or older, your body mass index should be between 23-30. Your Body mass index is 26.98 kg/m. If this is out of the aforementioned range listed, please consider follow up with your Primary Care Provider.  If you are age 45 or younger, your body mass index should be between 19-25. Your Body mass index is 26.98 kg/m. If this is out of the aformentioned range listed, please consider follow up with your Primary Care Provider.   You have been scheduled for an endoscopy. Please follow written instructions given to you at your visit today. If you use inhalers (even only as needed), please bring them with you on the day of your procedure. Your physician has requested that you go to www.startemmi.com and enter the access code given to you at your visit today. This web site gives a general overview about your procedure. However, you should still follow specific instructions given to you by our office regarding your preparation for the procedure.  Thank you for choosing me and [facility].   Alonza Bogus, PA-C

## 2019-10-11 NOTE — Progress Notes (Signed)
     10/11/2019 Chad Bruce [id number] 06-23-1976   HISTORY OF PRESENT ILLNESS: This is a 43 year old male who is new to our office.  He is here today as a self-referral.  He is here today with complaints of chest pain/discomfort.  He says that this pretty much started back in August.  He has had 2 emergency room visits, one in August, and one in October for these complaints.  Chest x-rays, EKG, and labs including troponins have all been unremarkable.  They told him that this is likely a muscle strain versus reflux related.  He feels like he does have some heartburn and reflux.  The discomfort his chest feels worse when he eats, but he denies any dysphagia.  They gave him a prescription for omeprazole 20 mg daily, but he says that he has not taken it.  He says he does not like to take things without being checked out first and being sure of what the cause of the symptoms are.   Past Medical History:  Diagnosis Date  . GERD (gastroesophageal reflux disease)    Past Surgical History:  Procedure Laterality Date  . MANDIBLE SURGERY     hairline fx    reports that he has never smoked. He has never used smokeless tobacco. He reports previous alcohol use. He reports that he does not use drugs. family history includes Diabetes in his father; Heart disease in his father; Prostate cancer in his father. No Known Allergies    Outpatient Encounter Medications as of 10/11/2019  Medication Sig  . [DISCONTINUED] naproxen (NAPROSYN) 500 MG tablet Take 1 tablet (500 mg total) by mouth 2 (two) times daily.  . [DISCONTINUED] omeprazole (PRILOSEC) 20 MG capsule Take 1 capsule (20 mg total) by mouth daily.   No facility-administered encounter medications on file as of 10/11/2019.      REVIEW OF SYSTEMS  : All other systems reviewed and negative except where noted in the History of Present Illness.   PHYSICAL EXAM: BP 120/70   Pulse 76   Temp 98.4 F (36.9 C)   Ht 5\' 10"  (1.778 m)   Wt 188  lb (85.3 kg)   BMI 26.98 kg/m  General: Well developed black male in no acute distress Head: Normocephalic and atraumatic Eyes:  Sclerae anicteric, conjunctiva pink. Ears: Normal auditory acuity Lungs: Clear throughout to auscultation; no increased WOB. Heart: Regular rate and rhythm; no M/R/G. Abdomen: Soft, non-distended.  BS present.  Non-tender. Musculoskeletal: Symmetrical with no gross deformities  Skin: No lesions on visible extremities Extremities: No edema  Neurological: Alert oriented x 4, grossly non-focal Psychological:  Alert and cooperative. Normal mood and affect  ASSESSMENT AND PLAN: *43 year old male with complaints of atypical chest pain.  Seems to occur with or after eating.  Does complain of some indigestion/heartburn.  Has been to the ER on 2 occasions with normal EKG and troponin.  They told him it was likely either reflux or a pulled muscle.  They gave him omeprazole 20 mg, but he has not taken it.  He says that he does not like to take things without knowing exactly what is causing the symptoms and he would like it evaluated.  We will schedule for EGD with Dr. Hilarie Fredrickson. **The risks, benefits, and alternatives to EGD were discussed with the patient and he consents to proceed.  CC:  [facility], [place]

## 2019-10-11 NOTE — Progress Notes (Signed)
Addendum: Reviewed and agree with assessment and management plan. Damara Klunder M, MD  

## 2019-10-24 ENCOUNTER — Encounter: Payer: Self-pay | Admitting: Internal Medicine

## 2019-10-24 ENCOUNTER — Ambulatory Visit (INDEPENDENT_AMBULATORY_CARE_PROVIDER_SITE_OTHER): Payer: BC Managed Care – PPO

## 2019-10-24 ENCOUNTER — Other Ambulatory Visit: Payer: Self-pay | Admitting: Internal Medicine

## 2019-10-24 DIAGNOSIS — Z1159 Encounter for screening for other viral diseases: Secondary | ICD-10-CM | POA: Diagnosis not present

## 2019-10-24 LAB — SARS CORONAVIRUS 2 (TAT 6-24 HRS): SARS Coronavirus 2: NEGATIVE

## 2019-10-26 ENCOUNTER — Ambulatory Visit (AMBULATORY_SURGERY_CENTER): Payer: BC Managed Care – PPO | Admitting: Internal Medicine

## 2019-10-26 ENCOUNTER — Other Ambulatory Visit: Payer: Self-pay

## 2019-10-26 ENCOUNTER — Encounter: Payer: Self-pay | Admitting: Internal Medicine

## 2019-10-26 VITALS — BP 112/68 | HR 81 | Temp 98.0°F | Resp 13 | Ht 70.0 in | Wt 188.0 lb

## 2019-10-26 DIAGNOSIS — B9681 Helicobacter pylori [H. pylori] as the cause of diseases classified elsewhere: Secondary | ICD-10-CM

## 2019-10-26 DIAGNOSIS — K2970 Gastritis, unspecified, without bleeding: Secondary | ICD-10-CM

## 2019-10-26 DIAGNOSIS — K2950 Unspecified chronic gastritis without bleeding: Secondary | ICD-10-CM

## 2019-10-26 DIAGNOSIS — K219 Gastro-esophageal reflux disease without esophagitis: Secondary | ICD-10-CM | POA: Diagnosis not present

## 2019-10-26 DIAGNOSIS — R0789 Other chest pain: Secondary | ICD-10-CM

## 2019-10-26 DIAGNOSIS — K2951 Unspecified chronic gastritis with bleeding: Secondary | ICD-10-CM | POA: Diagnosis not present

## 2019-10-26 MED ORDER — SODIUM CHLORIDE 0.9 % IV SOLN: 500.0000 mL | Freq: Once | INTRAVENOUS | Status: DC

## 2019-10-26 NOTE — Op Note (Signed)
[facility] Patient Name: Chad Bruce Procedure Date: 10/26/2019 2:55 PM MRN: [id number] Endoscopist: Jerene Bears , MD Age: 43 Referring MD:  Date of Birth: 01/10/1976 Gender: Male Account #: [id number] Procedure:                Upper GI endoscopy Indications:              Indigestion, Unexplained chest pain Medicines:                Monitored Anesthesia Care Procedure:                Pre-Anesthesia Assessment:                           - Prior to the procedure, a History and Physical                            was performed, and patient medications and                            allergies were reviewed. The patient's tolerance of                            previous anesthesia was also reviewed. The risks                            and benefits of the procedure and the sedation                            options and risks were discussed with the patient.                            All questions were answered, and informed consent                            was obtained. Prior Anticoagulants: The patient has                            taken no previous anticoagulant or antiplatelet                            agents. ASA Grade Assessment: II - A patient with                            mild systemic disease. After reviewing the risks                            and benefits, the patient was deemed in                            satisfactory condition to undergo the procedure.                           After obtaining informed consent, the endoscope was  passed under direct vision. Throughout the                            procedure, the patient's blood pressure, pulse, and                            oxygen saturations were monitored continuously. The                            Endoscope was introduced through the mouth, and                            advanced to the second part of duodenum. The upper                            GI endoscopy was  accomplished without difficulty.                            The patient tolerated the procedure well. Scope In: Scope Out: Findings:                 The examined esophagus was normal.                           Diffuse mild inflammation characterized by                            congestion (edema) was found in the gastric body                            and in the gastric antrum. Biopsies were taken with                            a cold forceps for histology and Helicobacter                            pylori testing.                           The cardia and gastric fundus were normal on                            retroflexion.                           The examined duodenum was normal. Complications:            No immediate complications. Estimated Blood Loss:     Estimated blood loss was minimal. Impression:               - Normal esophagus.                           - Gastritis. Biopsied.                           - Normal examined duodenum. Recommendation:           -  Patient has a contact number available for                            emergencies. The signs and symptoms of potential                            delayed complications were discussed with the                            patient. Return to normal activities tomorrow.                            Written discharge instructions were provided to the                            patient.                           - Resume previous diet.                           - Continue present medications.                           - Await pathology results. Beverley Fiedler, MD 10/26/2019 3:25:26 PM This report has been signed electronically.

## 2019-10-26 NOTE — Progress Notes (Signed)
Temp check by:JB Vital check by:KA  The medical and surgical history was reviewed and verified with the patient. 

## 2019-10-26 NOTE — Patient Instructions (Signed)
YOU HAD AN ENDOSCOPIC PROCEDURE TODAY AT THE [facility]:   Refer to the procedure report that was given to you for any specific questions about what was found during the examination.  If the procedure report does not answer your questions, please call your gastroenterologist to clarify.  If you requested that your care partner not be given the details of your procedure findings, then the procedure report has been included in a sealed envelope for you to review at your convenience later.  YOU SHOULD EXPECT: Some feelings of bloating in the abdomen. Passage of more gas than usual.  Walking can help get rid of the air that was put into your GI tract during the procedure and reduce the bloating. If you had a lower endoscopy (such as a colonoscopy or flexible sigmoidoscopy) you may notice spotting of blood in your stool or on the toilet paper. If you underwent a bowel prep for your procedure, you may not have a normal bowel movement for a few days.  Please Note:  You might notice some irritation and congestion in your nose or some drainage.  This is from the oxygen used during your procedure.  There is no need for concern and it should clear up in a day or so.  SYMPTOMS TO REPORT IMMEDIATELY:   Following upper endoscopy (EGD)  Vomiting of blood or coffee ground material  New chest pain or pain under the shoulder blades  Painful or persistently difficult swallowing  New shortness of breath  Fever of 100F or higher  Black, tarry-looking stools  For urgent or emergent issues, a gastroenterologist can be reached at any hour by calling [phone].   DIET:  We do recommend a small meal at first, but then you may proceed to your regular diet.  Drink plenty of fluids but you should avoid alcoholic beverages for 24 hours.  ACTIVITY:  You should plan to take it easy for the rest of today and you should NOT DRIVE or use heavy machinery until tomorrow (because of the sedation medicines used  during the test).    FOLLOW UP: Our staff will call the number listed on your records 48-72 hours following your procedure to check on you and address any questions or concerns that you may have regarding the information given to you following your procedure. If we do not reach you, we will leave a message.  We will attempt to reach you two times.  During this call, we will ask if you have developed any symptoms of COVID 19. If you develop any symptoms (ie: fever, flu-like symptoms, shortness of breath, cough etc.) before then, please call [phone].  If you test positive for Covid 19 in the 2 weeks post procedure, please call and report this information to us.    If any biopsies were taken you will be contacted by phone or by letter within the next 1-3 weeks.  Please call us at [phone] if you have not heard about the biopsies in 3 weeks.    SIGNATURES/CONFIDENTIALITY: You and/or your care partner have signed paperwork which will be entered into your electronic medical record.  These signatures attest to the fact that that the information above on your After Visit Summary has been reviewed and is understood.  Full responsibility of the confidentiality of this discharge information lies with you and/or your care-partner. 

## 2019-10-26 NOTE — Progress Notes (Signed)
Called to room to assist during endoscopic procedure.  Patient ID and intended procedure confirmed with present staff. Received instructions for my participation in the procedure from the performing physician.  

## 2019-10-26 NOTE — Progress Notes (Signed)
Report given to PACU, vssReport given to PACU, vss 

## 2019-10-30 ENCOUNTER — Telehealth: Payer: Self-pay

## 2019-10-30 NOTE — Telephone Encounter (Signed)
  Follow up Call-  Call back number 10/26/2019  Post procedure Call Back phone  # [phone]  Permission to leave phone message Yes  Some recent data might be hidden     Patient questions:  Do you have a fever, pain , or abdominal swelling? No. Pain Score  0 *  Have you tolerated food without any problems? Yes.    Have you been able to return to your normal activities? Yes.    Do you have any questions about your discharge instructions: Diet   No. Medications  No. Follow up visit  No.  Do you have questions or concerns about your Care? No.  Actions: * If pain score is 4 or above: No action needed, pain <4.  1. Have you developed a fever since your procedure? no  2.   Have you had an respiratory symptoms (SOB or cough) since your procedure? no  3.   Have you tested positive for COVID 19 since your procedure no  4.   Have you had any family members/close contacts diagnosed with the COVID 19 since your procedure?  no   If yes to any of these questions please route to Joylene John, RN and Alphonsa Gin, Therapist, sports.

## 2019-11-05 ENCOUNTER — Other Ambulatory Visit: Payer: Self-pay

## 2019-11-05 DIAGNOSIS — A048 Other specified bacterial intestinal infections: Secondary | ICD-10-CM

## 2019-11-05 MED ORDER — PYLERA 140-125-125 MG PO CAPS: 3.0000 | ORAL_CAPSULE | Freq: Three times a day (TID) | ORAL | 0 refills | Status: DC

## 2019-11-05 MED ORDER — PANTOPRAZOLE SODIUM 40 MG PO TBEC: 40.0000 mg | DELAYED_RELEASE_TABLET | Freq: Two times a day (BID) | ORAL | 0 refills | Status: DC

## 2019-12-04 DIAGNOSIS — K219 Gastro-esophageal reflux disease without esophagitis: Secondary | ICD-10-CM | POA: Diagnosis not present

## 2019-12-04 DIAGNOSIS — M940 Chondrocostal junction syndrome [Tietze]: Secondary | ICD-10-CM | POA: Diagnosis not present

## 2019-12-04 DIAGNOSIS — Z6827 Body mass index (BMI) 27.0-27.9, adult: Secondary | ICD-10-CM | POA: Diagnosis not present

## 2019-12-04 DIAGNOSIS — R0789 Other chest pain: Secondary | ICD-10-CM | POA: Diagnosis not present

## 2019-12-28 DIAGNOSIS — Z20828 Contact with and (suspected) exposure to other viral communicable diseases: Secondary | ICD-10-CM | POA: Diagnosis not present

## 2019-12-28 DIAGNOSIS — Z20822 Contact with and (suspected) exposure to covid-19: Secondary | ICD-10-CM | POA: Diagnosis not present

## 2020-01-01 DIAGNOSIS — M9901 Segmental and somatic dysfunction of cervical region: Secondary | ICD-10-CM | POA: Diagnosis not present

## 2020-01-01 DIAGNOSIS — M5383 Other specified dorsopathies, cervicothoracic region: Secondary | ICD-10-CM | POA: Diagnosis not present

## 2020-02-01 DIAGNOSIS — M9901 Segmental and somatic dysfunction of cervical region: Secondary | ICD-10-CM | POA: Diagnosis not present

## 2020-02-01 DIAGNOSIS — M5383 Other specified dorsopathies, cervicothoracic region: Secondary | ICD-10-CM | POA: Diagnosis not present

## 2020-02-11 DIAGNOSIS — M545 Low back pain: Secondary | ICD-10-CM | POA: Diagnosis not present

## 2020-02-11 DIAGNOSIS — Z6828 Body mass index (BMI) 28.0-28.9, adult: Secondary | ICD-10-CM | POA: Diagnosis not present

## 2020-02-11 DIAGNOSIS — M542 Cervicalgia: Secondary | ICD-10-CM | POA: Diagnosis not present

## 2020-02-12 DIAGNOSIS — M542 Cervicalgia: Secondary | ICD-10-CM | POA: Diagnosis not present

## 2020-02-12 DIAGNOSIS — Z0000 Encounter for general adult medical examination without abnormal findings: Secondary | ICD-10-CM | POA: Diagnosis not present

## 2020-02-12 DIAGNOSIS — Z6827 Body mass index (BMI) 27.0-27.9, adult: Secondary | ICD-10-CM | POA: Diagnosis not present

## 2020-02-12 DIAGNOSIS — Z13220 Encounter for screening for lipoid disorders: Secondary | ICD-10-CM | POA: Diagnosis not present

## 2020-02-12 DIAGNOSIS — M545 Low back pain: Secondary | ICD-10-CM | POA: Diagnosis not present

## 2020-02-12 DIAGNOSIS — M2560 Stiffness of unspecified joint, not elsewhere classified: Secondary | ICD-10-CM | POA: Diagnosis not present

## 2020-02-13 DIAGNOSIS — M542 Cervicalgia: Secondary | ICD-10-CM | POA: Diagnosis not present

## 2020-02-13 DIAGNOSIS — M2560 Stiffness of unspecified joint, not elsewhere classified: Secondary | ICD-10-CM | POA: Diagnosis not present

## 2020-02-13 DIAGNOSIS — M545 Low back pain: Secondary | ICD-10-CM | POA: Diagnosis not present

## 2020-03-26 DIAGNOSIS — R197 Diarrhea, unspecified: Secondary | ICD-10-CM | POA: Diagnosis not present

## 2020-03-26 DIAGNOSIS — Z20828 Contact with and (suspected) exposure to other viral communicable diseases: Secondary | ICD-10-CM | POA: Diagnosis not present

## 2020-03-26 DIAGNOSIS — R05 Cough: Secondary | ICD-10-CM | POA: Diagnosis not present

## 2020-03-26 DIAGNOSIS — R52 Pain, unspecified: Secondary | ICD-10-CM | POA: Diagnosis not present

## 2020-04-22 ENCOUNTER — Emergency Department (HOSPITAL_COMMUNITY)
Admission: EM | Admit: 2020-04-22 | Discharge: 2020-04-23 | Disposition: A | Discharge status: Home / Self Care | Payer: BC Managed Care – PPO | Attending: Emergency Medicine | Admitting: Emergency Medicine

## 2020-04-22 ENCOUNTER — Other Ambulatory Visit: Payer: Self-pay

## 2020-04-22 ENCOUNTER — Emergency Department (HOSPITAL_COMMUNITY): Payer: BC Managed Care – PPO

## 2020-04-22 ENCOUNTER — Encounter (HOSPITAL_COMMUNITY): Payer: Self-pay

## 2020-04-22 DIAGNOSIS — R072 Precordial pain: Secondary | ICD-10-CM | POA: Diagnosis not present

## 2020-04-22 DIAGNOSIS — R079 Chest pain, unspecified: Secondary | ICD-10-CM | POA: Diagnosis not present

## 2020-04-22 DIAGNOSIS — R0789 Other chest pain: Secondary | ICD-10-CM | POA: Diagnosis not present

## 2020-04-22 LAB — CBC
HCT: 42.6 % (ref 39.0–52.0)
Hemoglobin: 14.7 g/dL (ref 13.0–17.0)
MCH: 31.7 pg (ref 26.0–34.0)
MCHC: 34.5 g/dL (ref 30.0–36.0)
MCV: 91.8 fL (ref 80.0–100.0)
Platelets: 185 10*3/uL (ref 150–400)
RBC: 4.64 MIL/uL (ref 4.22–5.81)
RDW: 12.2 % (ref 11.5–15.5)
WBC: 5.2 10*3/uL (ref 4.0–10.5)
nRBC: 0 % (ref 0.0–0.2)

## 2020-04-22 LAB — TROPONIN I (HIGH SENSITIVITY)
Troponin I (High Sensitivity): 3 ng/L (ref <18)
Troponin I (High Sensitivity): 3 ng/L (ref <18)

## 2020-04-22 LAB — BASIC METABOLIC PANEL
Anion gap: 10 (ref 5–15)
BUN: 12 mg/dL (ref 6–20)
CO2: 28 mmol/L (ref 22–32)
Calcium: 9.8 mg/dL (ref 8.9–10.3)
Chloride: 103 mmol/L (ref 98–111)
Creatinine, Ser: 1.15 mg/dL (ref 0.61–1.24)
GFR calc Af Amer: >60 mL/min (ref >60)
GFR calc non Af Amer: >60 mL/min (ref >60)
Glucose, Bld: 84 mg/dL (ref 70–99)
Potassium: 4.5 mmol/L (ref 3.5–5.1)
Sodium: 141 mmol/L (ref 135–145)

## 2020-04-22 MED ORDER — SODIUM CHLORIDE 0.9% FLUSH: 3.0000 mL | Freq: Once | INTRAVENOUS | Status: DC

## 2020-04-22 NOTE — ED Triage Notes (Signed)
Pt arrives to ED w/ c/o intermittent right sided non-radiating 7/10 chest pain x 1 week. Pt denies SOB, n/v.

## 2020-04-23 NOTE — ED Notes (Signed)
Patient verbalizes understanding of discharge instructions. Opportunity for questioning and answers were provided. Armband removed by staff, pt discharged from ED ambulatory.   

## 2020-04-23 NOTE — ED Provider Notes (Signed)
[facility] EMERGENCY DEPARTMENT Provider Note   CSN: [id number] Arrival date & time: 04/22/20  1648     History Chief Complaint  Patient presents with  . Chest Pain    Chad Bruce. is a 44 y.o. male.  44 y.o male with a PMH of GERD presents to the ED with a chief complaint of right-sided chest pain times "2 weeks ".  Patient reports  "feels like I worked out, muscles feel sore after on my chest".  Reports this has been ongoing for a while, however, "I wanted to get it checked out tonight ".  Denies any shortness of breath, quit cigarette smoking about 15 years ago.    The history is provided by the patient and medical records.  Chest Pain Pain location:  Substernal area and R chest Pain quality: aching   Pain radiates to:  Does not radiate Pain severity:  Mild Onset quality:  Sudden Duration:  2 weeks Timing:  Intermittent Progression:  Resolved Chronicity:  Recurrent Context: not lifting and not movement   Worsened by:  Nothing Associated symptoms: no abdominal pain, no back pain, no claudication, no fever, no headache, no nausea, no shortness of breath and no vomiting   Risk factors: male sex   Risk factors: no coronary artery disease, no diabetes mellitus, no hypertension and no smoking        Past Medical History:  Diagnosis Date  . GERD (gastroesophageal reflux disease)     Patient Active Problem List   Diagnosis Date Noted  . Atypical chest pain 10/11/2019  . Gastroesophageal reflux disease 10/11/2019  . Screening for viral disease 10/11/2019    Past Surgical History:  Procedure Laterality Date  . MANDIBLE SURGERY     hairline fx       Family History  Problem Relation Age of Onset  . Diabetes Father        and it runs on his Dad's side  . Heart disease Father        tripe bypass  . Prostate cancer Father   . Colon cancer Neg Hx   . Esophageal cancer Neg Hx   . Rectal cancer Neg Hx   . Stomach cancer Neg Hx      Social History   Tobacco Use  . Smoking status: Never Smoker  . Smokeless tobacco: Never Used  Substance Use Topics  . Alcohol use: Not Currently  . Drug use: Never    Home Medications Prior to Admission medications   Medication Sig Start Date End Date Taking? Authorizing Provider  bismuth-metronidazole-tetracycline [facility]) [phone] MG capsule Take 3 capsules by mouth 4 (four) times daily -  before meals and at bedtime. Patient not taking: Reported on 04/23/2020 11/05/19   Beverley Fiedler, MD  pantoprazole (PROTONIX) 40 MG tablet Take 1 tablet (40 mg total) by mouth 2 (two) times daily. Patient not taking: Reported on 04/23/2020 11/05/19   Pyrtle, Carie Caddy, MD    Allergies    Patient has no known allergies.  Review of Systems   Review of Systems  Constitutional: Negative for fever.  HENT: Negative for sore throat.   Respiratory: Negative for shortness of breath and wheezing.   Cardiovascular: Positive for chest pain. Negative for claudication and leg swelling.  Gastrointestinal: Negative for abdominal pain, diarrhea, nausea and vomiting.  Genitourinary: Negative for flank pain.  Musculoskeletal: Negative for back pain.  Skin: Negative for pallor and wound.  Neurological: Negative for headaches.  All other systems  reviewed and are negative.   Physical Exam Updated Vital Signs BP 130/80   Pulse (!) 47   Temp 98.3 F (36.8 C)   Resp 17   SpO2 100%   Physical Exam Vitals and nursing note reviewed.  Constitutional:      Appearance: He is well-developed.     Comments: Non ill, non toxic.   HENT:     Head: Normocephalic and atraumatic.  Eyes:     General: No scleral icterus.    Pupils: Pupils are equal, round, and reactive to light.  Cardiovascular:     Heart sounds: Normal heart sounds.  Pulmonary:     Effort: Pulmonary effort is normal.     Breath sounds: Normal breath sounds. No wheezing.  Chest:     Chest wall: No tenderness.  Abdominal:     General:  Bowel sounds are normal. There is no distension.     Palpations: Abdomen is soft.     Tenderness: There is no abdominal tenderness.  Musculoskeletal:        General: No tenderness or deformity.     Cervical back: Normal range of motion.  Skin:    General: Skin is warm and dry.  Neurological:     Mental Status: He is alert and oriented to person, place, and time.     ED Results / Procedures / Treatments   Labs (all labs ordered are listed, but only abnormal results are displayed) Labs Reviewed  BASIC METABOLIC PANEL  CBC  TROPONIN I (HIGH SENSITIVITY)  TROPONIN I (HIGH SENSITIVITY)    EKG EKG Interpretation  Date/Time:  Wednesday Apr 23 2020 02:28:29 EDT Ventricular Rate:  45 PR Interval:  156 QRS Duration: 90 QT Interval:  425 QTC Calculation: 368 R Axis:   84 Text Interpretation: Sinus bradycardia Confirmed by Ripley Fraise [phone]) on 04/23/2020 2:51:42 AM   Radiology DG Chest 2 View  Result Date: 04/22/2020 CLINICAL DATA:  Right-sided chest pain EXAM: CHEST - 2 VIEW COMPARISON:  09/24/2019 FINDINGS: The heart size and mediastinal contours are within normal limits. Both lungs are clear. The visualized skeletal structures are unremarkable. IMPRESSION: No active cardiopulmonary disease. Electronically Signed   By: Misty Stanley M.D.   On: 04/22/2020 17:50    Procedures Procedures (including critical care time)  Medications Ordered in ED Medications  sodium chloride flush (NS) 0.9 % injection 3 mL (has no administration in time range)    ED Course  I have reviewed the triage vital signs and the nursing notes.  Pertinent labs & imaging results that were available during my care of the patient were reviewed by me and considered in my medical decision making (see chart for details).    MDM Rules/Calculators/A&P   With a past medical history of GERD with multiple visits to the ED for atypical chest pain without any cardiology follow-up presents to the ED today  with complaints of right-sided chest pain which began "a wild ".  Versus a feeling of soreness, does not radiate anywhere, has not taken any medication for improvement in symptoms.  Reports he was seen previously by his physician, had a medication prescribed which help with his symptoms however does not know what medicine medication was.  During evaluation patient is overall well-appearing, nontoxic, vitals are within normal limits, he is afebrile.  Without any tachycardia or hypoxia.  Lungs are clear to auscultation, chest is nontender to palpation, there is tenderness along his neck along the trapezius distribution, exacerbated with movement.  Diminished soft  nontender to palpation, lower extremities without any pitting edema or calf tenderness noted.  Position of his labs revealed a CBC without any leukocytosis, no signs of anemia.  BMP without any electrolyte abnormality.  Creatinine level is within normal limits.  Troponin x2 remained flat.  EKG without any significant changes consistent with STEMI or infarct.  Chest x-ray without any consolidation, pneumothorax, pleural effusions.  He is hemodynamically stable.  Low suspicion for ACS, patient has had multiple visits for the same complaint, does have a history of GERD but has not been taking medication for this.    HEART SCORE 1  Low suspicion for dissection, pulses are symmetric, there is no neurological deficit on my exam.  No pain radiating to the back.  No hemoptysis, tachycardia or hypoxia low suspicion for pulmonary embolism.  I discussed results of his labs with patient at length, he is requesting medication that Dr. Shela Commons wrote for him, unsure what this medication is, I offer him muscle relaxers for his neck pain that exacerbated with movement, he refused this at this time.  We discussed symptomatic treatment with ibuprofen or Tylenol.  Patient does have some bradycardia while in the ED, does not report any dizziness, shortness of  breath.    Portions of this note were generated with Scientist, clinical (histocompatibility and immunogenetics). Dictation errors may occur despite best attempts at proofreading.  Final Clinical Impression(s) / ED Diagnoses Final diagnoses:  Atypical chest pain    Rx / DC Orders ED Discharge Orders    None       Claude Manges, PA-C 04/23/20 0301    Zadie Rhine, MD 04/23/20 [phone]

## 2020-04-23 NOTE — Discharge Instructions (Addendum)
Your laboratory results are within normal limits today.  I have provided a referral to cardiology, if this pain persist you will need to schedule an appointment with them for follow-up.  You may alternate ibuprofen or Tylenol for your symptoms.

## 2020-04-24 ENCOUNTER — Ambulatory Visit (INDEPENDENT_AMBULATORY_CARE_PROVIDER_SITE_OTHER): Payer: BC Managed Care – PPO | Admitting: Cardiology

## 2020-04-24 ENCOUNTER — Encounter: Payer: Self-pay | Admitting: Cardiology

## 2020-04-24 ENCOUNTER — Other Ambulatory Visit: Payer: Self-pay

## 2020-04-24 VITALS — BP 116/78 | HR 61 | Ht 70.0 in | Wt 190.2 lb

## 2020-04-24 DIAGNOSIS — R0609 Other forms of dyspnea: Secondary | ICD-10-CM

## 2020-04-24 DIAGNOSIS — R0600 Dyspnea, unspecified: Secondary | ICD-10-CM

## 2020-04-24 DIAGNOSIS — E785 Hyperlipidemia, unspecified: Secondary | ICD-10-CM | POA: Diagnosis not present

## 2020-04-24 DIAGNOSIS — R0789 Other chest pain: Secondary | ICD-10-CM | POA: Diagnosis not present

## 2020-04-24 HISTORY — DX: Other forms of dyspnea: R06.09

## 2020-04-24 HISTORY — DX: Hyperlipidemia, unspecified: E78.5

## 2020-04-24 HISTORY — DX: Dyspnea, unspecified: R06.00

## 2020-04-24 NOTE — Progress Notes (Signed)
Cardiology Consultation:    Date:  04/24/2020   ID:  Chad Levins., DOB [birth year], MRN [id number]  PCP:  [facility]  Cardiologist:  Jenne Campus, MD   Referring MD: Ripley Fraise, MD   Chief Complaint  Patient presents with  . Chest Pain    History of Present Illness:    Chad Bentz. is a 44 y.o. male who is being seen today for the evaluation of chest pain at the request of Ripley Fraise, MD.  He does have past medical history significant for gastroesophageal reflux disease.  For last few months he has been experiencing chest pain on and off.  Sensation typically last between seconds to few minutes, not related to exercise, he has difficulty describing the characteristic of the sensation.  He graded the strength of it in about 5 and scale up to 10, there is no shortness of breath there was no sweating associated with this sensation.  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  There is no chest pain associated with this sensation just shortness of breath.  He does have history of GERD however the sensation he came to me with is something completely different.  He did have 2 visits in the emergency room recently because of the chest sensation, every single time troponin I has been negative.  His EKG does have some minimal ST segment changes fairly typical for early repolarization abnormalities but there was some suspicion for potential pericarditis. He quit smoking 15 years ago He does have cholesterol checked recently which was elevated and the plan is to recheck it. Does not have family history of premature coronary artery disease  Past Medical History:  Diagnosis Date  . GERD (gastroesophageal reflux disease)     Past Surgical History:  Procedure Laterality Date  . MANDIBLE SURGERY     hairline fx    Current  Medications: Current Meds  Medication Sig  . fluticasone (FLONASE) 50 MCG/ACT nasal spray Place 2 sprays into both nostrils daily.     Allergies:   Patient has no known allergies.   Social History   Socioeconomic History  . Marital status: Single    Spouse name: Not on file  . Number of children: 4  . Years of education: Not on file  . Highest education level: Not on file  Occupational History  . Occupation: Roofing/lead tech  Tobacco Use  . Smoking status: Never Smoker  . Smokeless tobacco: Never Used  Substance and Sexual Activity  . Alcohol use: Not Currently  . Drug use: Never  . Sexual activity: Not on file  Other Topics Concern  . Not on file  Social History Narrative  . Not on file   Social Determinants of Health   Financial Resource Strain:   . Difficulty of Paying Living Expenses:   Food Insecurity:   . Worried About Charity fundraiser in the Last Year:   . Arboriculturist in the Last Year:   Transportation Needs:   . Film/video editor (Medical):   Marland Kitchen Lack of Transportation (Non-Medical):   Physical Activity:   . Days of Exercise per Week:   . Minutes of Exercise per Session:   Stress:   . Feeling of Stress :   Social Connections:   . Frequency of Communication with Friends and Family:   .  Frequency of Social Gatherings with Friends and Family:   . Attends Religious Services:   . Active Member of Clubs or Organizations:   . Attends Banker Meetings:   Marland Kitchen Marital Status:      Family History: The patient's family history includes Diabetes in his father; Heart disease in his father; Prostate cancer in his father. There is no history of Colon cancer, Esophageal cancer, Rectal cancer, or Stomach cancer. ROS:   Please see the history of present illness.    All 14 point review of systems negative except as described per history of present illness.  EKGs/Labs/Other Studies Reviewed:    The following studies were reviewed today: Normal  sinus rhythm, normal P interval, early repolarization abnormalities.  Record from the emergency reviewed, troponin eyes high-sensitivity was normal x3.    Recent Labs: 04/22/2020: BUN 12; Creatinine, Ser 1.15; Hemoglobin 14.7; Platelets 185; Potassium 4.5; Sodium 141  Recent Lipid Panel No results found for: CHOL, TRIG, HDL, CHOLHDL, VLDL, LDLCALC, LDLDIRECT  Physical Exam:    VS:  BP 116/78   Pulse 61   Ht 5\' 10"  (1.778 m)   Wt 190 lb 3.2 oz (86.3 kg)   SpO2 99%   BMI 27.29 kg/m     Wt Readings from Last 3 Encounters:  04/24/20 190 lb 3.2 oz (86.3 kg)  10/26/19 188 lb (85.3 kg)  10/11/19 188 lb (85.3 kg)     GEN:  Well nourished, well developed in no acute distress HEENT: Normal NECK: No JVD; No carotid bruits LYMPHATICS: No lymphadenopathy CARDIAC: RRR, no murmurs, no rubs, no gallops RESPIRATORY:  Clear to auscultation without rales, wheezing or rhonchi  ABDOMEN: Soft, non-tender, non-distended MUSCULOSKELETAL:  No edema; No deformity  SKIN: Warm and dry NEUROLOGIC:  Alert and oriented x 3 PSYCHIATRIC:  Normal affect   ASSESSMENT:    1. Atypical chest pain   2. Dyslipidemia   3. Dyspnea on exertion    PLAN:    In order of problems listed above:  1. Atypical chest pain.  Not related to exercise lasting between seconds and minute he does have difficulty describing characteristic of the pain.  At the same time he is able to walk climb stairs, climb ladders and work physically without difficulties.  I doubt we dealing with coronary events.  He does have some risk factors for weight which include his history of smoking, likely he quit, also dyslipidemia.  The plan will be I will ask him to have sed rate as well as high-sensitivity C-reactive protein looking for inflammatory markers to rule out pericarditis.  EKG does not look typical for this condition but story is somewhat concerning.  As a part of evaluation I will ask him to have echocardiogram done to recheck left  ventricle ejection fraction look at the pericardium.  If those tests are negative then will consider doing stress testing however overall have low level suspicion that this is coronary event. 2. Dyslipidemia we will do fasting lipid profile today. 3. Dyspnea on exertion echocardiogram will be done.   Medication Adjustments/Labs and Tests Ordered: Current medicines are reviewed at length with the patient today.  Concerns regarding medicines are outlined above.  No orders of the defined types were placed in this encounter.  No orders of the defined types were placed in this encounter.   Signed, 13/05/20, MD, [facility]. 04/24/2020 11:31 AM    [facility]

## 2020-04-24 NOTE — Patient Instructions (Signed)
Medication Instructions:  Your physician recommends that you continue on your current medications as directed. Please refer to the Current Medication list given to you today.  *If you need a refill on your cardiac medications before your next appointment, please call your pharmacy*   Lab Work: Your physician recommends that you return for lab work today: sed rate, crp   If you have labs (blood work) drawn today and your tests are completely normal, you will receive your results only by: Marland Kitchen MyChart Message (if you have MyChart) OR . A paper copy in the mail If you have any lab test that is abnormal or we need to change your treatment, we will call you to review the results.   Testing/Procedures: Your physician has requested that you have an echocardiogram. Echocardiography is a painless test that uses sound waves to create images of your heart. It provides your doctor with information about the size and shape of your heart and how well your heart's chambers and valves are working. This procedure takes approximately one hour. There are no restrictions for this procedure.     Follow-Up: At [facility], you and your health needs are our priority.  As part of our continuing mission to provide you with exceptional heart care, we have created designated Provider Care Teams.  These Care Teams include your primary Cardiologist (physician) and Advanced Practice Providers (APPs -  Physician Assistants and Nurse Practitioners) who all work together to provide you with the care you need, when you need it.  We recommend signing up for the patient portal called "MyChart".  Sign up information is provided on this After Visit Summary.  MyChart is used to connect with patients for Virtual Visits (Telemedicine).  Patients are able to view lab/test results, encounter notes, upcoming appointments, etc.  Non-urgent messages can be sent to your provider as well.   To learn more about what you can do with  MyChart, go to [email or web address].    Your next appointment:   6 week(s)  The format for your next appointment:   In Person  Provider:   Gypsy Balsam, MD   Other Instructions   Echocardiogram An echocardiogram is a procedure that uses painless sound waves (ultrasound) to produce an image of the heart. Images from an echocardiogram can provide important information about:  Signs of coronary artery disease (CAD).  Aneurysm detection. An aneurysm is a weak or damaged part of an artery wall that bulges out from the normal force of blood pumping through the body.  Heart size and shape. Changes in the size or shape of the heart can be associated with certain conditions, including heart failure, aneurysm, and CAD.  Heart muscle function.  Heart valve function.  Signs of a past heart attack.  Fluid buildup around the heart.  Thickening of the heart muscle.  A tumor or infectious growth around the heart valves. Tell a health care provider about:  Any allergies you have.  All medicines you are taking, including vitamins, herbs, eye drops, creams, and over-the-counter medicines.  Any blood disorders you have.  Any surgeries you have had.  Any medical conditions you have.  Whether you are pregnant or may be pregnant. What are the risks? Generally, this is a safe procedure. However, problems may occur, including:  Allergic reaction to dye (contrast) that may be used during the procedure. What happens before the procedure? No specific preparation is needed. You may eat and drink normally. What happens during the procedure?   An  IV tube may be inserted into one of your veins.  You may receive contrast through this tube. A contrast is an injection that improves the quality of the pictures from your heart.  A gel will be applied to your chest.  A wand-like tool (transducer) will be moved over your chest. The gel will help to transmit the sound waves from the  transducer.  The sound waves will harmlessly bounce off of your heart to allow the heart images to be captured in real-time motion. The images will be recorded on a computer. The procedure may vary among health care providers and hospitals. What happens after the procedure?  You may return to your normal, everyday life, including diet, activities, and medicines, unless your health care provider tells you not to do that. Summary  An echocardiogram is a procedure that uses painless sound waves (ultrasound) to produce an image of the heart.  Images from an echocardiogram can provide important information about the size and shape of your heart, heart muscle function, heart valve function, and fluid buildup around your heart.  You do not need to do anything to prepare before this procedure. You may eat and drink normally.  After the echocardiogram is completed, you may return to your normal, everyday life, unless your health care provider tells you not to do that. This information is not intended to replace advice given to you by your health care provider. Make sure you discuss any questions you have with your health care provider. Document Revised: 03/15/2019 Document Reviewed: 12/25/2016 Elsevier Patient Education  2020 Elsevier Inc.   

## 2020-04-24 NOTE — Addendum Note (Signed)
Addended by: Lita Mains on: 04/24/2020 11:49 AM   Modules accepted: Orders

## 2020-04-25 LAB — HIGH SENSITIVITY CRP: CRP, High Sensitivity: 0.67 mg/L (ref 0.00–3.00)

## 2020-04-25 LAB — SEDIMENTATION RATE: Sed Rate: 8 mm/hr (ref 0–15)

## 2020-04-29 ENCOUNTER — Telehealth: Payer: Self-pay | Admitting: Cardiology

## 2020-04-29 NOTE — Telephone Encounter (Signed)
Patient returning call for lab results. Transferred call to the nurse. 

## 2020-04-29 NOTE — Telephone Encounter (Signed)
Patient informed of lab results. 

## 2020-05-07 DIAGNOSIS — Z6827 Body mass index (BMI) 27.0-27.9, adult: Secondary | ICD-10-CM | POA: Diagnosis not present

## 2020-05-07 DIAGNOSIS — S80219A Abrasion, unspecified knee, initial encounter: Secondary | ICD-10-CM | POA: Diagnosis not present

## 2020-05-07 DIAGNOSIS — M25561 Pain in right knee: Secondary | ICD-10-CM | POA: Diagnosis not present

## 2020-05-08 ENCOUNTER — Other Ambulatory Visit: Payer: Self-pay

## 2020-05-08 ENCOUNTER — Ambulatory Visit (INDEPENDENT_AMBULATORY_CARE_PROVIDER_SITE_OTHER): Payer: BC Managed Care – PPO

## 2020-05-08 DIAGNOSIS — E785 Hyperlipidemia, unspecified: Secondary | ICD-10-CM

## 2020-05-08 DIAGNOSIS — R0789 Other chest pain: Secondary | ICD-10-CM

## 2020-05-08 DIAGNOSIS — R0600 Dyspnea, unspecified: Secondary | ICD-10-CM

## 2020-05-08 NOTE — Progress Notes (Signed)
  Echocardiogram 2D Echocardiogram has been performed.  Leta Jungling [facility]   05/08/2020 13:27

## 2020-06-05 ENCOUNTER — Ambulatory Visit (INDEPENDENT_AMBULATORY_CARE_PROVIDER_SITE_OTHER): Payer: BC Managed Care – PPO | Admitting: Cardiology

## 2020-06-05 ENCOUNTER — Other Ambulatory Visit: Payer: Self-pay

## 2020-06-05 VITALS — BP 110/66 | HR 61 | Ht 70.0 in | Wt 191.8 lb

## 2020-06-05 DIAGNOSIS — R0600 Dyspnea, unspecified: Secondary | ICD-10-CM | POA: Diagnosis not present

## 2020-06-05 DIAGNOSIS — R0609 Other forms of dyspnea: Secondary | ICD-10-CM

## 2020-06-05 DIAGNOSIS — K219 Gastro-esophageal reflux disease without esophagitis: Secondary | ICD-10-CM | POA: Diagnosis not present

## 2020-06-05 DIAGNOSIS — R0789 Other chest pain: Secondary | ICD-10-CM

## 2020-06-05 DIAGNOSIS — E785 Hyperlipidemia, unspecified: Secondary | ICD-10-CM | POA: Diagnosis not present

## 2020-06-05 NOTE — Progress Notes (Signed)
Cardiology Office Note:    Date:  06/05/2020   ID:  Chad Bill., DOB 1976-10-21, MRN [id number]  PCP:  [facility]  Cardiologist:  Gypsy Balsam, MD    Referring MD: [facility]   No chief complaint on file. Doing better still have some chest pain but very rarely  History of Present Illness:    Chad Bruce. is a 44 y.o. male with no significant past medical history.  He was referred to [place] because of atypical chest pain.  Pain did not happen with exercise.  He said that he still gets some episode of chest pain.  Pain happened typically when he presses chest wall.  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  We did evaluation for potentially pericarditis since his EKG showed some early repolarization, but but sedimentation rate as well as high-sensitivity C-reactive protein were negative.  Therefore, I doubt very much this diagnosis.  I do not see any indication for pursuing ischemia work-up since he is able to exercise heavily with zero symptoms.  He does have history of GERD and he is taking proton pump inhibitor which I think is a good idea.  I also told him that he may need to take some nonsteroidal anti-inflammatory medication to see if it helps with his pain.  Past Medical History:  Diagnosis Date  . GERD (gastroesophageal reflux disease)     Past Surgical History:  Procedure Laterality Date  . MANDIBLE SURGERY     hairline fx    Current Medications: Current Meds  Medication Sig  . bismuth-metronidazole-tetracycline (PYLERA) 140-125-125 MG capsule Take 3 capsules by mouth 4 (four) times daily -  before meals and at bedtime.  . fluticasone (FLONASE) 50 MCG/ACT nasal spray Place 2 sprays into both nostrils daily.  . meloxicam (MOBIC) 15 MG tablet Take 15 mg by mouth daily as needed.  . pantoprazole  (PROTONIX) 40 MG tablet Take 1 tablet (40 mg total) by mouth 2 (two) times daily.     Allergies:   Patient has no known allergies.   Social History   Socioeconomic History  . Marital status: Single    Spouse name: Not on file  . Number of children: 4  . Years of education: Not on file  . Highest education level: Not on file  Occupational History  . Occupation: Roofing/lead tech  Tobacco Use  . Smoking status: Never Smoker  . Smokeless tobacco: Never Used  Vaping Use  . Vaping Use: Never used  Substance and Sexual Activity  . Alcohol use: Not Currently  . Drug use: Never  . Sexual activity: Not on file  Other Topics Concern  . Not on file  Social History Narrative  . Not on file   Social Determinants of Health   Financial Resource Strain:   . Difficulty of Paying Living Expenses:   Food Insecurity:   . Worried About Programme researcher, broadcasting/film/video in the Last Year:   . Barista in the Last Year:   Transportation Needs:   . Freight forwarder (Medical):   Marland Kitchen Lack of Transportation (Non-Medical):   Physical Activity:   . Days of Exercise per Week:   . Minutes of Exercise per Session:   Stress:   . Feeling of Stress :  Social Connections:   . Frequency of Communication with Friends and Family:   . Frequency of Social Gatherings with Friends and Family:   . Attends Religious Services:   . Active Member of Clubs or Organizations:   . Attends Banker Meetings:   Marland Kitchen Marital Status:      Family History: The patient's family history includes Diabetes in his father; Heart disease in his father; Prostate cancer in his father. There is no history of Colon cancer, Esophageal cancer, Rectal cancer, or Stomach cancer. ROS:   Please see the history of present illness.    All 14 point review of systems negative except as described per history of present illness  EKGs/Labs/Other Studies Reviewed:      Recent Labs: 04/22/2020: BUN 12; Creatinine, Ser 1.15;  Hemoglobin 14.7; Platelets 185; Potassium 4.5; Sodium 141  Recent Lipid Panel No results found for: CHOL, TRIG, HDL, CHOLHDL, VLDL, LDLCALC, LDLDIRECT  Physical Exam:    VS:  BP 110/66   Pulse 61   Ht 5\' 10"  (1.778 m)   Wt 191 lb 12.8 oz (87 kg)   SpO2 94%   BMI 27.52 kg/m     Wt Readings from Last 3 Encounters:  06/05/20 191 lb 12.8 oz (87 kg)  04/24/20 190 lb 3.2 oz (86.3 kg)  10/26/19 188 lb (85.3 kg)     GEN:  Well nourished, well developed in no acute distress HEENT: Normal NECK: No JVD; No carotid bruits LYMPHATICS: No lymphadenopathy CARDIAC: RRR, no murmurs, no rubs, no gallops RESPIRATORY:  Clear to auscultation without rales, wheezing or rhonchi  ABDOMEN: Soft, non-tender, non-distended MUSCULOSKELETAL:  No edema; No deformity  SKIN: Warm and dry LOWER EXTREMITIES: no swelling NEUROLOGIC:  Alert and oriented x 3 PSYCHIATRIC:  Normal affect   ASSESSMENT:    1. Atypical chest pain   2. Dyslipidemia   3. Dyspnea on exertion   4. Gastroesophageal reflux disease, unspecified whether esophagitis present    PLAN:    In order of problems listed above:  1. Atypical chest pain.  Does not look like it is related to his heart.  He does not have any symptoms while working hard carrying heavy object.  He does not have risk factors significant for coronary artery disease.  Therefore I will simply watch the situation. 2. Dyslipidemia: I do have his fasting lipid profile from March with LDL of 173 and HDL of 38.  We initiated conversation about potentially taking cholesterol medication.  He wants to think about it. 3. Gastroesophageal reflux disease: He is on proton pump inhibitor which I will continue.   Medication Adjustments/Labs and Tests Ordered: Current medicines are reviewed at length with the patient today.  Concerns regarding medicines are outlined above.  No orders of the defined types were placed in this encounter.  Medication changes: No orders of the defined  types were placed in this encounter.   Signed, April, MD, [facility] 06/05/2020 4:57 PM    [facility]

## 2020-06-05 NOTE — Patient Instructions (Addendum)

## 2020-06-18 DIAGNOSIS — E7800 Pure hypercholesterolemia, unspecified: Secondary | ICD-10-CM | POA: Diagnosis not present

## 2020-06-18 DIAGNOSIS — R0789 Other chest pain: Secondary | ICD-10-CM | POA: Diagnosis not present

## 2020-06-18 DIAGNOSIS — M25462 Effusion, left knee: Secondary | ICD-10-CM | POA: Diagnosis not present

## 2020-06-18 DIAGNOSIS — M25562 Pain in left knee: Secondary | ICD-10-CM | POA: Diagnosis not present

## 2020-06-19 DIAGNOSIS — M7042 Prepatellar bursitis, left knee: Secondary | ICD-10-CM

## 2020-06-19 HISTORY — DX: Prepatellar bursitis, left knee: M70.42

## 2020-08-26 DIAGNOSIS — M7042 Prepatellar bursitis, left knee: Secondary | ICD-10-CM | POA: Diagnosis not present

## 2020-09-09 ENCOUNTER — Emergency Department (HOSPITAL_COMMUNITY)
Admission: EM | Admit: 2020-09-09 | Discharge: 2020-09-09 | Discharge status: Absent without leave | Payer: BC Managed Care – PPO

## 2020-09-09 DIAGNOSIS — R079 Chest pain, unspecified: Secondary | ICD-10-CM | POA: Diagnosis not present

## 2020-09-09 DIAGNOSIS — R0602 Shortness of breath: Secondary | ICD-10-CM | POA: Diagnosis not present

## 2020-09-09 DIAGNOSIS — R7989 Other specified abnormal findings of blood chemistry: Secondary | ICD-10-CM | POA: Diagnosis not present

## 2020-09-09 DIAGNOSIS — R0789 Other chest pain: Secondary | ICD-10-CM | POA: Diagnosis not present

## 2020-09-19 DIAGNOSIS — Z2821 Immunization not carried out because of patient refusal: Secondary | ICD-10-CM | POA: Diagnosis not present

## 2020-09-19 DIAGNOSIS — Z6827 Body mass index (BMI) 27.0-27.9, adult: Secondary | ICD-10-CM | POA: Diagnosis not present

## 2020-09-19 DIAGNOSIS — R1084 Generalized abdominal pain: Secondary | ICD-10-CM | POA: Diagnosis not present

## 2020-09-25 DIAGNOSIS — R109 Unspecified abdominal pain: Secondary | ICD-10-CM | POA: Diagnosis not present

## 2020-09-25 DIAGNOSIS — R1084 Generalized abdominal pain: Secondary | ICD-10-CM | POA: Diagnosis not present

## 2021-02-20 ENCOUNTER — Other Ambulatory Visit: Payer: Self-pay

## 2021-02-20 DIAGNOSIS — K219 Gastro-esophageal reflux disease without esophagitis: Secondary | ICD-10-CM | POA: Insufficient documentation

## 2021-02-23 ENCOUNTER — Other Ambulatory Visit: Payer: Self-pay

## 2021-02-23 ENCOUNTER — Encounter: Payer: Self-pay | Admitting: Cardiology

## 2021-02-23 ENCOUNTER — Ambulatory Visit (INDEPENDENT_AMBULATORY_CARE_PROVIDER_SITE_OTHER): Payer: BC Managed Care – PPO | Admitting: Cardiology

## 2021-02-23 VITALS — BP 110/70 | HR 77 | Ht 69.0 in | Wt 196.0 lb

## 2021-02-23 DIAGNOSIS — R0600 Dyspnea, unspecified: Secondary | ICD-10-CM | POA: Diagnosis not present

## 2021-02-23 DIAGNOSIS — R0609 Other forms of dyspnea: Secondary | ICD-10-CM

## 2021-02-23 DIAGNOSIS — E785 Hyperlipidemia, unspecified: Secondary | ICD-10-CM

## 2021-02-23 DIAGNOSIS — R0789 Other chest pain: Secondary | ICD-10-CM | POA: Diagnosis not present

## 2021-02-23 MED ORDER — METOPROLOL TARTRATE 100 MG PO TABS
100.0000 mg | ORAL_TABLET | Freq: Once | ORAL | 0 refills | Status: DC
Start: 2021-02-23 — End: 2021-05-21

## 2021-02-23 MED ORDER — ASPIRIN EC 81 MG PO TBEC
81.0000 mg | DELAYED_RELEASE_TABLET | Freq: Every day | ORAL | 3 refills | Status: DC
Start: 2021-02-23 — End: 2022-01-01

## 2021-02-23 NOTE — Patient Instructions (Signed)
Medication Instructions:  Your physician has recommended you make the following change in your medication:  START: Aspirin 81 mg daily  *If you need a refill on your cardiac medications before your next appointment, please call your pharmacy*   Lab Work: None. If you have labs (blood work) drawn today and your tests are completely normal, you will receive your results only by: Marland Kitchen MyChart Message (if you have MyChart) OR . A paper copy in the mail If you have any lab test that is abnormal or we need to change your treatment, we will call you to review the results.   Testing/Procedures: Your cardiac CT will be scheduled at one of the below locations:   [facility] [address] [place], [place] [address] [phone]  OR  [facility] [address] [place], [place] [address] [phone]  If scheduled at [facility], please arrive at the [facility] main entrance (entrance A) of [facility] 30 minutes prior to test start time. Proceed to the [facility] Radiology Department (first floor) to check-in and test prep.  If scheduled at [facility], please arrive 15 mins early for check-in and test prep.  Please follow these instructions carefully (unless otherwise directed):  Hold all erectile dysfunction medications at least 3 days (72 hrs) prior to test.  On the Night Before the Test: . Be sure to Drink plenty of water. . Do not consume any caffeinated/decaffeinated beverages or chocolate 12 hours prior to your test. . Do not take any antihistamines 12 hours prior to your test.   On the Day of the Test: . Drink plenty of water until 1 hour prior to the test. . Do not eat any food 4 hours prior to the test. . You may take your regular medications prior to the test.  . Take metoprolol (Lopressor) two hours prior to test.          After the Test: . Drink plenty of  water. . After receiving IV contrast, you may experience a mild flushed feeling. This is normal. . On occasion, you may experience a mild rash up to 24 hours after the test. This is not dangerous. If this occurs, you can take Benadryl 25 mg and increase your fluid intake. . If you experience trouble breathing, this can be serious. If it is severe call 911 IMMEDIATELY. If it is mild, please call our office. . If you take any of these medications: Glipizide/Metformin, Avandament, Glucavance, please do not take 48 hours after completing test unless otherwise instructed.   Once we have confirmed authorization from your insurance company, we will call you to set up a date and time for your test. Based on how quickly your insurance processes prior authorizations requests, please allow up to 4 weeks to be contacted for scheduling your Cardiac CT appointment. Be advised that routine Cardiac CT appointments could be scheduled as many as 8 weeks after your provider has ordered it.  For non-scheduling related questions, please contact the cardiac imaging nurse navigator should you have any questions/concerns: Rockwell Alexandria, Cardiac Imaging Nurse Navigator Larey Brick, Cardiac Imaging Nurse Navigator [facility] Direct Office Dial: [phone]   For scheduling needs, including cancellations and rescheduling, please call [place], [phone].    Follow-Up: At [facility], you and your health needs are our priority.  As part of our continuing mission to provide you with exceptional heart care, we have created  designated Provider Care Teams.  These Care Teams include your primary Cardiologist (physician) and Advanced Practice Providers (APPs -  Physician Assistants and Nurse Practitioners) who all work together to provide you with the care you need, when you need it.  We recommend signing up for the patient portal called "MyChart".  Sign up information is provided on this  After Visit Summary.  MyChart is used to connect with patients for Virtual Visits (Telemedicine).  Patients are able to view lab/test results, encounter notes, upcoming appointments, etc.  Non-urgent messages can be sent to your provider as well.   To learn more about what you can do with MyChart, go to [email or web address].    Your next appointment:   2 month(s)  The format for your next appointment:   In Person  Provider:   Gypsy Balsam, MD   Other Instructions   Cardiac CT Angiogram A cardiac CT angiogram is a procedure to look at the heart and the area around the heart. It may be done to help find the cause of chest pains or other symptoms of heart disease. During this procedure, a substance called contrast dye is injected into the blood vessels in the area to be checked. A large X-ray machine, called a CT scanner, then takes detailed pictures of the heart and the surrounding area. The procedure is also sometimes called a coronary CT angiogram, coronary artery scanning, or CTA. A cardiac CT angiogram allows the health care provider to see how well blood is flowing to and from the heart. The health care provider will be able to see if there are any problems, such as:  Blockage or narrowing of the coronary arteries in the heart.  Fluid around the heart.  Signs of weakness or disease in the muscles, valves, and tissues of the heart. Tell a health care provider about:  Any allergies you have. This is especially important if you have had a previous allergic reaction to contrast dye.  All medicines you are taking, including vitamins, herbs, eye drops, creams, and over-the-counter medicines.  Any blood disorders you have.  Any surgeries you have had.  Any medical conditions you have.  Whether you are pregnant or may be pregnant.  Any anxiety disorders, chronic pain, or other conditions you have that may increase your stress or prevent you from lying still. What are the  risks? Generally, this is a safe procedure. However, problems may occur, including:  Bleeding.  Infection.  Allergic reactions to medicines or dyes.  Damage to other structures or organs.  Kidney damage from the contrast dye that is used.  Increased risk of cancer from radiation exposure. This risk is low. Talk with your health care provider about: ? The risks and benefits of testing. ? How you can receive the lowest dose of radiation. What happens before the procedure?  Wear comfortable clothing and remove any jewelry, glasses, dentures, and hearing aids.  Follow instructions from your health care provider about eating and drinking. This may include: ? For 12 hours before the procedure -- avoid caffeine. This includes tea, coffee, soda, energy drinks, and diet pills. Drink plenty of water or other fluids that do not have caffeine in them. Being well hydrated can prevent complications. ? For 4-6 hours before the procedure -- stop eating and drinking. The contrast dye can cause nausea, but this is less likely if your stomach is empty.  Ask your health care provider about changing or stopping your regular medicines. This is especially important if you  are taking diabetes medicines, blood thinners, or medicines to treat problems with erections (erectile dysfunction). What happens during the procedure?  Hair on your chest may need to be removed so that small sticky patches called electrodes can be placed on your chest. These will transmit information that helps to monitor your heart during the procedure.  An IV will be inserted into one of your veins.  You might be given a medicine to control your heart rate during the procedure. This will help to ensure that good images are obtained.  You will be asked to lie on an exam table. This table will slide in and out of the CT machine during the procedure.  Contrast dye will be injected into the IV. You might feel warm, or you may get a  metallic taste in your mouth.  You will be given a medicine called nitroglycerin. This will relax or dilate the arteries in your heart.  The table that you are lying on will move into the CT machine tunnel for the scan.  The person running the machine will give you instructions while the scans are being done. You may be asked to: ? Keep your arms above your head. ? Hold your breath. ? Stay very still, even if the table is moving.  When the scanning is complete, you will be moved out of the machine.  The IV will be removed. The procedure may vary among health care providers and hospitals.   What can I expect after the procedure? After your procedure, it is common to have:  A metallic taste in your mouth from the contrast dye.  A feeling of warmth.  A headache from the nitroglycerin. Follow these instructions at home:  Take over-the-counter and prescription medicines only as told by your health care provider.  If you are told, drink enough fluid to keep your urine pale yellow. This will help to flush the contrast dye out of your body.  Most people can return to their normal activities right after the procedure. Ask your health care provider what activities are safe for you.  It is up to you to get the results of your procedure. Ask your health care provider, or the department that is doing the procedure, when your results will be ready.  Keep all follow-up visits as told by your health care provider. This is important. Contact a health care provider if:  You have any symptoms of allergy to the contrast dye. These include: ? Shortness of breath. ? Rash or hives. ? A racing heartbeat. Summary  A cardiac CT angiogram is a procedure to look at the heart and the area around the heart. It may be done to help find the cause of chest pains or other symptoms of heart disease.  During this procedure, a large X-ray machine, called a CT scanner, takes detailed pictures of the heart and  the surrounding area after a contrast dye has been injected into blood vessels in the area.  Ask your health care provider about changing or stopping your regular medicines before the procedure. This is especially important if you are taking diabetes medicines, blood thinners, or medicines to treat erectile dysfunction.  If you are told, drink enough fluid to keep your urine pale yellow. This will help to flush the contrast dye out of your body. This information is not intended to replace advice given to you by your health care provider. Make sure you discuss any questions you have with your health care provider. Document  Revised: 07/18/2019 Document Reviewed: 07/18/2019 Elsevier Patient Education  2021 [employer]

## 2021-02-23 NOTE — Addendum Note (Signed)
Addended by: Hazle Quant on: 02/23/2021 02:50 PM   Modules accepted: Orders

## 2021-02-23 NOTE — Addendum Note (Signed)
Addended by: Hazle Quant on: 02/23/2021 02:55 PM   Modules accepted: Orders

## 2021-02-23 NOTE — Progress Notes (Signed)
Cardiology Office Note:    Date:  02/23/2021   ID:  Chad Bill., DOB 1976-01-15, MRN [id number]  PCP:  [facility]  Cardiologist:  Gypsy Balsam, MD    Referring MD: [facility], [facility] F*   Chief Complaint  Patient presents with  . chest pain x 2 weeks    History of Present Illness:    Chad Mckowen. is a 45 y.o. male with past medical history significant for atypical chest pain, dyslipidemia, dyspnea on exertion.  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  However, he noticed when he eats and then he goes working that he will develop some tightness in the chest sometimes into slowdown.  He does have significant dyslipidemia, history of smoking but quit smoking years ago.  No family history of premature coronary disease.  He got partially reproducible pain in the chest by pressing chest wall previously evaluated and evaluation was negative.  He did never have formal ischemia evaluation.  Past Medical History:  Diagnosis Date  . Atypical chest pain 10/11/2019  . Chronic sinusitis 06/27/2019  . Deviated nasal septum 06/27/2019  . Dyslipidemia 04/24/2020  . Dyspnea on exertion 04/24/2020  . Gastroesophageal reflux disease 10/11/2019  . GERD (gastroesophageal reflux disease)   . Prepatellar bursitis, left knee 06/19/2020  . Right knee pain 05/19/2015  . Screening for viral disease 10/11/2019  . Sprain of medial collateral ligament of right knee 06/16/2015    Past Surgical History:  Procedure Laterality Date  . MANDIBLE SURGERY     hairline fx    Current Medications: Current Meds  Medication Sig  . fluticasone (FLONASE) 50 MCG/ACT nasal spray Place 2 sprays into both nostrils daily.     Allergies:   Patient has no known allergies.   Social History   Socioeconomic History  . Marital status: Single    Spouse name: Not on file  . Number of children: 4  . Years of education: Not on file  . Highest education level: Not on  file  Occupational History  . Occupation: Roofing/lead tech  Tobacco Use  . Smoking status: Never Smoker  . Smokeless tobacco: Never Used  Vaping Use  . Vaping Use: Never used  Substance and Sexual Activity  . Alcohol use: Not Currently  . Drug use: Never  . Sexual activity: Not on file  Other Topics Concern  . Not on file  Social History Narrative  . Not on file   Social Determinants of Health   Financial Resource Strain: Not on file  Food Insecurity: Not on file  Transportation Needs: Not on file  Physical Activity: Not on file  Stress: Not on file  Social Connections: Not on file     Family History: The patient's family history includes Diabetes in his father; Heart disease in his father; Prostate cancer in his father. There is no history of Colon cancer, Esophageal cancer, Rectal cancer, or Stomach cancer. ROS:   Please see the history of present illness.    All 14 point review of systems negative except as described per history of present illness  EKGs/Labs/Other Studies Reviewed:      Recent Labs: 04/22/2020: BUN 12; Creatinine, Ser 1.15; Hemoglobin 14.7; Platelets 185; Potassium 4.5; Sodium 141  Recent Lipid Panel No results found for: CHOL, TRIG, HDL, CHOLHDL, VLDL, LDLCALC, LDLDIRECT  Physical Exam:    VS:  BP 110/70 (BP Location: Right Arm, Patient Position: Sitting)   Pulse 77  Ht 5\' 9"  (1.753 m)   Wt 196 lb (88.9 kg)   SpO2 98%   BMI 28.94 kg/m     Wt Readings from Last 3 Encounters:  02/23/21 196 lb (88.9 kg)  06/05/20 191 lb 12.8 oz (87 kg)  04/24/20 190 lb 3.2 oz (86.3 kg)     GEN:  Well nourished, well developed in no acute distress HEENT: Normal NECK: No JVD; No carotid bruits LYMPHATICS: No lymphadenopathy CARDIAC: RRR, no murmurs, no rubs, no gallops RESPIRATORY:  Clear to auscultation without rales, wheezing or rhonchi  ABDOMEN: Soft, non-tender, non-distended MUSCULOSKELETAL:  No edema; No deformity  SKIN: Warm and dry LOWER  EXTREMITIES: no swelling NEUROLOGIC:  Alert and oriented x 3 PSYCHIATRIC:  Normal affect   ASSESSMENT:    1. Atypical chest pain   2. Dyspnea on exertion   3. Dyslipidemia    PLAN:    In order of problems listed above:  1. Atypical chest pain but some worrisome features.  I think we reached the point that more definite test need to be performed.  I think he would be perfect candidate for coronary CT angio.  I will schedule him to have the test done.  I asked him to start taking 1 baby aspirin every single day dyspnea on exertion echocardiogram done last time showed preserved normal left ventricle ejection fraction. 2. Dyslipidemia he requested fasting lipid profile done today which we will do.  His last HDL was 38 but his LDL was 161.  He will require statin I anticipate in the future.   Medication Adjustments/Labs and Tests Ordered: Current medicines are reviewed at length with the patient today.  Concerns regarding medicines are outlined above.  No orders of the defined types were placed in this encounter.  Medication changes: No orders of the defined types were placed in this encounter.   Signed, 04/26/20, MD, [facility] 02/23/2021 2:35 PM    [facility]

## 2021-02-23 NOTE — Progress Notes (Signed)
ekg 

## 2021-02-24 LAB — LIPID PANEL
Chol/HDL Ratio: 6.2 ratio — ABNORMAL HIGH (ref 0.0–5.0)
Cholesterol, Total: 249 mg/dL — ABNORMAL HIGH (ref 100–199)
HDL: 40 mg/dL (ref >39)
LDL Chol Calc (NIH): 174 mg/dL — ABNORMAL HIGH (ref 0–99)
Triglycerides: 186 mg/dL — ABNORMAL HIGH (ref 0–149)
VLDL Cholesterol Cal: 35 mg/dL (ref 5–40)

## 2021-02-25 ENCOUNTER — Telehealth: Payer: Self-pay | Admitting: Emergency Medicine

## 2021-02-25 DIAGNOSIS — E785 Hyperlipidemia, unspecified: Secondary | ICD-10-CM

## 2021-02-25 MED ORDER — ROSUVASTATIN CALCIUM 10 MG PO TABS
10.0000 mg | ORAL_TABLET | Freq: Every day | ORAL | 1 refills | Status: DC
Start: 2021-02-25 — End: 2021-09-21

## 2021-02-25 NOTE — Telephone Encounter (Signed)
-----   Message from Georgeanna Lea, MD sent at 02/25/2021 10:57 AM EDT ----- Cholesterol very high, please start Crestor 10 mg daily, fasting lipid profile, AST ALT should be repeated in 6 weeks

## 2021-02-25 NOTE — Telephone Encounter (Signed)
Called patient informed him of results. He will start crestor 10 mg daily and repeat labs in 6 weeks. No further questions.

## 2021-02-27 NOTE — Telephone Encounter (Signed)
Called patient informed him that crestor and aspirin do not have known interactions that I know of but he can be sure and check with pharmacy. He understood. No further questions.

## 2021-02-27 NOTE — Telephone Encounter (Signed)
Pt c/o medication issue:  1. Name of Medication:   rosuvastatin (CRESTOR) 10 MG tablet    2. How are you currently taking this medication (dosage and times per day)? N/a  3. Are you having a reaction (difficulty breathing--STAT)? No   4. What is your medication issue?  Patient is following up regarding starting Crestor. He would like to ensure that it will not interact with his aspirin EC 81 MG tablet.  Please advise.

## 2021-03-10 ENCOUNTER — Telehealth (HOSPITAL_COMMUNITY): Payer: Self-pay | Admitting: Emergency Medicine

## 2021-03-10 NOTE — Telephone Encounter (Signed)
Patient wishing to r/s test. New appt 4/11 at 7:45   Rockwell Alexandria RN Navigator Cardiac Imaging [facility] [phone] Office  [phone] Cell

## 2021-03-11 ENCOUNTER — Ambulatory Visit (HOSPITAL_COMMUNITY): Payer: BC Managed Care – PPO

## 2021-03-12 ENCOUNTER — Telehealth (HOSPITAL_COMMUNITY): Payer: Self-pay | Admitting: *Deleted

## 2021-03-12 NOTE — Telephone Encounter (Signed)
Reaching out to patient to offer assistance regarding upcoming cardiac imaging study; pt verbalizes understanding of appt date/time, parking situation and where to check in, pre-test NPO status and medications ordered, and verified current allergies; name and call back number provided for further questions should they arise  Hunter Bachar RN Navigator Cardiac Imaging [facility] [phone] office [phone] cell  

## 2021-03-16 ENCOUNTER — Ambulatory Visit (HOSPITAL_COMMUNITY)
Admission: RE | Admit: 2021-03-16 | Discharge: 2021-03-16 | Disposition: A | Discharge status: Home / Self Care | Payer: BC Managed Care – PPO | Source: Ambulatory Visit | Attending: Cardiology | Admitting: Cardiology

## 2021-03-16 ENCOUNTER — Other Ambulatory Visit: Payer: Self-pay

## 2021-03-16 ENCOUNTER — Encounter: Payer: BC Managed Care – PPO | Admitting: *Deleted

## 2021-03-16 ENCOUNTER — Encounter (HOSPITAL_COMMUNITY): Payer: Self-pay

## 2021-03-16 DIAGNOSIS — E785 Hyperlipidemia, unspecified: Secondary | ICD-10-CM | POA: Diagnosis not present

## 2021-03-16 DIAGNOSIS — R0789 Other chest pain: Secondary | ICD-10-CM | POA: Insufficient documentation

## 2021-03-16 DIAGNOSIS — Z006 Encounter for examination for normal comparison and control in clinical research program: Secondary | ICD-10-CM

## 2021-03-16 DIAGNOSIS — R0600 Dyspnea, unspecified: Secondary | ICD-10-CM | POA: Diagnosis not present

## 2021-03-16 DIAGNOSIS — R0609 Other forms of dyspnea: Secondary | ICD-10-CM

## 2021-03-16 MED ORDER — NITROGLYCERIN 0.4 MG SL SUBL
SUBLINGUAL_TABLET | SUBLINGUAL | Status: AC
  Filled 2021-03-16: qty 2

## 2021-03-16 MED ORDER — IOHEXOL 350 MG/ML SOLN
80.0000 mL | Freq: Once | INTRAVENOUS | Status: AC | PRN
  Administered 2021-03-16: 80 mL via INTRAVENOUS

## 2021-03-16 MED ORDER — NITROGLYCERIN 0.4 MG SL SUBL
0.8000 mg | SUBLINGUAL_TABLET | Freq: Once | SUBLINGUAL | Status: AC
  Administered 2021-03-16: 0.8 mg via SUBLINGUAL

## 2021-03-24 NOTE — Research (Signed)
IDENTIFY Informed Consent   Subject Name: Chad Bruce.  Subject met inclusion and exclusion criteria.  The informed consent form, study requirements and expectations were reviewed with the subject and questions and concerns were addressed prior to the signing of the consent form.  The subject verbalized understanding of the trial requirements.  The subject agreed to participate in the IDENTIFY trial and signed the informed consent at 0702 on 03/16/2021.  The informed consent was obtained prior to performance of any protocol-specific procedures for the subject.  A copy of the signed informed consent was given to the subject and a copy was placed in the subject's medical record.   [name] D

## 2021-04-21 DIAGNOSIS — E785 Hyperlipidemia, unspecified: Secondary | ICD-10-CM | POA: Diagnosis not present

## 2021-04-21 LAB — LIPID PANEL
Chol/HDL Ratio: 4.7 ratio (ref 0.0–5.0)
Cholesterol, Total: 191 mg/dL (ref 100–199)
HDL: 41 mg/dL (ref >39)
LDL Chol Calc (NIH): 132 mg/dL — ABNORMAL HIGH (ref 0–99)
Triglycerides: 101 mg/dL (ref 0–149)
VLDL Cholesterol Cal: 18 mg/dL (ref 5–40)

## 2021-04-21 LAB — HEPATIC FUNCTION PANEL
ALT: 38 IU/L (ref 0–44)
AST: 29 IU/L (ref 0–40)
Albumin: 5 g/dL (ref 4.0–5.0)
Alkaline Phosphatase: 73 IU/L (ref 44–121)
Bilirubin Total: 1 mg/dL (ref 0.0–1.2)
Bilirubin, Direct: 0.2 mg/dL (ref 0.00–0.40)
Total Protein: 7.7 g/dL (ref 6.0–8.5)

## 2021-04-24 ENCOUNTER — Telehealth: Payer: Self-pay | Admitting: Emergency Medicine

## 2021-04-24 NOTE — Telephone Encounter (Signed)
-----   Message from Georgeanna Lea, MD sent at 04/23/2021  8:45 PM EDT ----- Cholesterol elevated but better than before.  We will decide about potential therapy after we get the rest of the test back

## 2021-04-28 NOTE — Telephone Encounter (Signed)
Tried to call patient back no answer voicemail full. Will continue efforts.

## 2021-04-30 DIAGNOSIS — M7711 Lateral epicondylitis, right elbow: Secondary | ICD-10-CM | POA: Diagnosis not present

## 2021-04-30 NOTE — Telephone Encounter (Signed)
Tried calling back number busy  

## 2021-05-07 NOTE — Telephone Encounter (Signed)
Called patient informed him of Dr. Vanetta Shawl message no further questions.

## 2021-05-21 ENCOUNTER — Other Ambulatory Visit: Payer: Self-pay

## 2021-05-21 ENCOUNTER — Ambulatory Visit (INDEPENDENT_AMBULATORY_CARE_PROVIDER_SITE_OTHER): Payer: BC Managed Care – PPO | Admitting: Cardiology

## 2021-05-21 ENCOUNTER — Encounter: Payer: Self-pay | Admitting: Cardiology

## 2021-05-21 VITALS — BP 110/72 | HR 56 | Ht 69.0 in | Wt 190.0 lb

## 2021-05-21 DIAGNOSIS — R0600 Dyspnea, unspecified: Secondary | ICD-10-CM

## 2021-05-21 DIAGNOSIS — R0789 Other chest pain: Secondary | ICD-10-CM

## 2021-05-21 DIAGNOSIS — E785 Hyperlipidemia, unspecified: Secondary | ICD-10-CM

## 2021-05-21 DIAGNOSIS — R0609 Other forms of dyspnea: Secondary | ICD-10-CM

## 2021-05-21 NOTE — Patient Instructions (Signed)
Medication Instructions:  Your physician recommends that you continue on your current medications as directed. Please refer to the Current Medication list given to you today.  *If you need a refill on your cardiac medications before your next appointment, please call your pharmacy*   Lab Work: none If you have labs (blood work) drawn today and your tests are completely normal, you will receive your results only by: MyChart Message (if you have MyChart) OR A paper copy in the mail If you have any lab test that is abnormal or we need to change your treatment, we will call you to review the results.   Testing/Procedures: None   Follow-Up: At [facility], you and your health needs are our priority.  As part of our continuing mission to provide you with exceptional heart care, we have created designated Provider Care Teams.  These Care Teams include your primary Cardiologist (physician) and Advanced Practice Providers (APPs -  Physician Assistants and Nurse Practitioners) who all work together to provide you with the care you need, when you need it.  We recommend signing up for the patient portal called "MyChart".  Sign up information is provided on this After Visit Summary.  MyChart is used to connect with patients for Virtual Visits (Telemedicine).  Patients are able to view lab/test results, encounter notes, upcoming appointments, etc.  Non-urgent messages can be sent to your provider as well.   To learn more about what you can do with MyChart, go to [email or web address].    Your next appointment:   As needed

## 2021-05-21 NOTE — Progress Notes (Signed)
Cardiology Office Note:    Date:  05/21/2021   ID:  [name]., DOB [birth year], MRN [id number]  PCP:  Noni Saupe, MD  Cardiologist:  Gypsy Balsam, MD    Referring MD: Physicians, Cheryln Manly F*   Chief Complaint  Patient presents with   Follow-up  I am doing well  History of Present Illness:    Chad Bruce. is a 45 y.o. male who was originally referred to [place] because of chest pain.  He did have quite extensive evaluation done that evaluation included echocardiogram to assess left ventricular ejection fraction as well as coronary CT angio which showed normal coronaries with calcium score 0.  He is coming today to my office to discuss those issues. He is completely asymptomatic.  He denies have any chest pain tightness squeezing pressure burning chest.  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  That work requires climbing ladders and have no problem doing overall doing well.  Past Medical History:  Diagnosis Date   Atypical chest pain 10/11/2019   Chronic sinusitis 06/27/2019   Deviated nasal septum 06/27/2019   Dyslipidemia 04/24/2020   Dyspnea on exertion 04/24/2020   Gastroesophageal reflux disease 10/11/2019   GERD (gastroesophageal reflux disease)    Prepatellar bursitis, left knee 06/19/2020   Right knee pain 05/19/2015   Screening for viral disease 10/11/2019   Sprain of medial collateral ligament of right knee 06/16/2015    Past Surgical History:  Procedure Laterality Date   MANDIBLE SURGERY     hairline fx    Current Medications: Current Meds  Medication Sig   aspirin EC 81 MG tablet Take 1 tablet (81 mg total) by mouth daily. Swallow whole.   fluticasone (FLONASE) 50 MCG/ACT nasal spray Place 2 sprays into both nostrils daily.   rosuvastatin (CRESTOR) 10 MG tablet Take 1 tablet (10 mg total) by mouth daily.     Allergies:   Patient has no known allergies.   Social History   Socioeconomic History   Marital  status: Single    Spouse name: Not on file   Number of children: 4   Years of education: Not on file   Highest education level: Not on file  Occupational History   Occupation: Roofing/lead tech  Tobacco Use   Smoking status: Never   Smokeless tobacco: Never  Vaping Use   Vaping Use: Never used  Substance and Sexual Activity   Alcohol use: Not Currently   Drug use: Never   Sexual activity: Not on file  Other Topics Concern   Not on file  Social History Narrative   Not on file   Social Determinants of Health   Financial Resource Strain: Not on file  Food Insecurity: Not on file  Transportation Needs: Not on file  Physical Activity: Not on file  Stress: Not on file  Social Connections: Not on file     Family History: The patient's family history includes Diabetes in his father; Heart disease in his father; Prostate cancer in his father. There is no history of Colon cancer, Esophageal cancer, Rectal cancer, or Stomach cancer. ROS:   Please see the history of present illness.    All 14 point review of systems negative except as described per history of present illness  EKGs/Labs/Other Studies Reviewed:      Recent Labs: 04/21/2021: ALT 38  Recent Lipid Panel    Component Value Date/Time   CHOL 191 04/21/2021 0811  TRIG 101 04/21/2021 0811   HDL 41 04/21/2021 0811   CHOLHDL 4.7 04/21/2021 0811   LDLCALC 132 (H) 04/21/2021 0811    Physical Exam:    VS:  BP 110/72 (BP Location: Left Arm, Patient Position: Sitting, Cuff Size: Normal)   Pulse (!) 56   Ht 5\' 9"  (1.753 m)   Wt 190 lb (86.2 kg)   SpO2 98%   BMI 28.06 kg/m     Wt Readings from Last 3 Encounters:  05/21/21 190 lb (86.2 kg)  02/23/21 196 lb (88.9 kg)  06/05/20 191 lb 12.8 oz (87 kg)     GEN:  Well nourished, well developed in no acute distress HEENT: Normal NECK: No JVD; No carotid bruits LYMPHATICS: No lymphadenopathy CARDIAC: RRR, no murmurs, no rubs, no gallops RESPIRATORY:  Clear to  auscultation without rales, wheezing or rhonchi  ABDOMEN: Soft, non-tender, non-distended MUSCULOSKELETAL:  No edema; No deformity  SKIN: Warm and dry LOWER EXTREMITIES: no swelling NEUROLOGIC:  Alert and oriented x 3 PSYCHIATRIC:  Normal affect   ASSESSMENT:    1. Atypical chest pain   2. Dyslipidemia   3. Dyspnea on exertion    PLAN:    In order of problems listed above:  Atypical chest pain now asymptomatic all work-up negative coronary CT angio normal.  We will ask him to discontinue aspirin. Dyslipidemia with Crestor he is LDL is 132 HDL 41 and advised him to continue that medication.  Previously before he initiated Crestor K PN show me his LDL 174 Dyspnea on exertion: Denies having any   Medication Adjustments/Labs and Tests Ordered: Current medicines are reviewed at length with the patient today.  Concerns regarding medicines are outlined above.  No orders of the defined types were placed in this encounter.  Medication changes: No orders of the defined types were placed in this encounter.   Signed, 08/06/20, MD, [facility] 05/21/2021 3:20 PM    [facility]

## 2021-09-19 IMAGING — CR DG CHEST 2V
2 series · 2 of 2 positions shown · non-contrast
Comparison: 07/18/2019

CLINICAL DATA: Chest pain

EXAM:
CHEST - 2 VIEW

[chest pa]
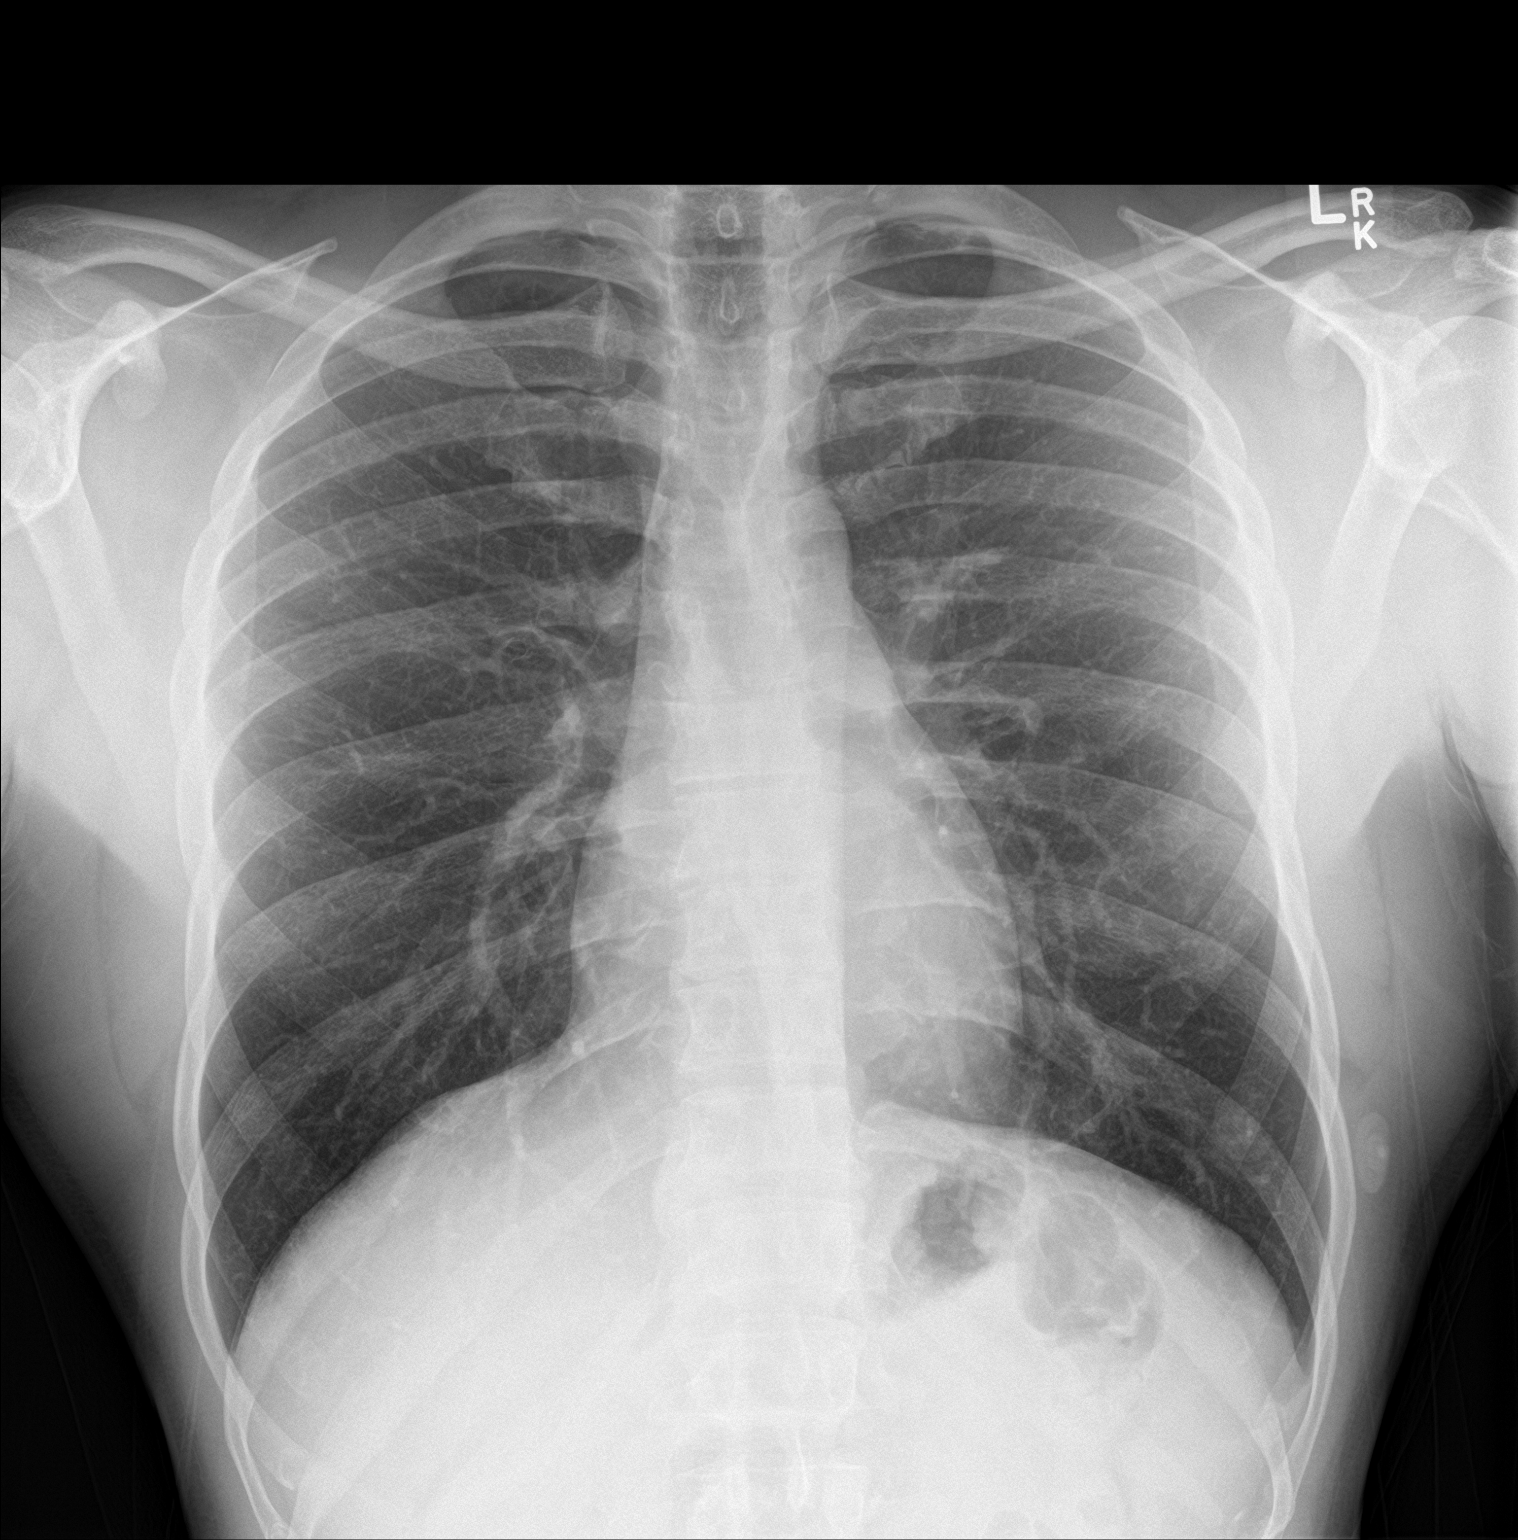

[chest lat]
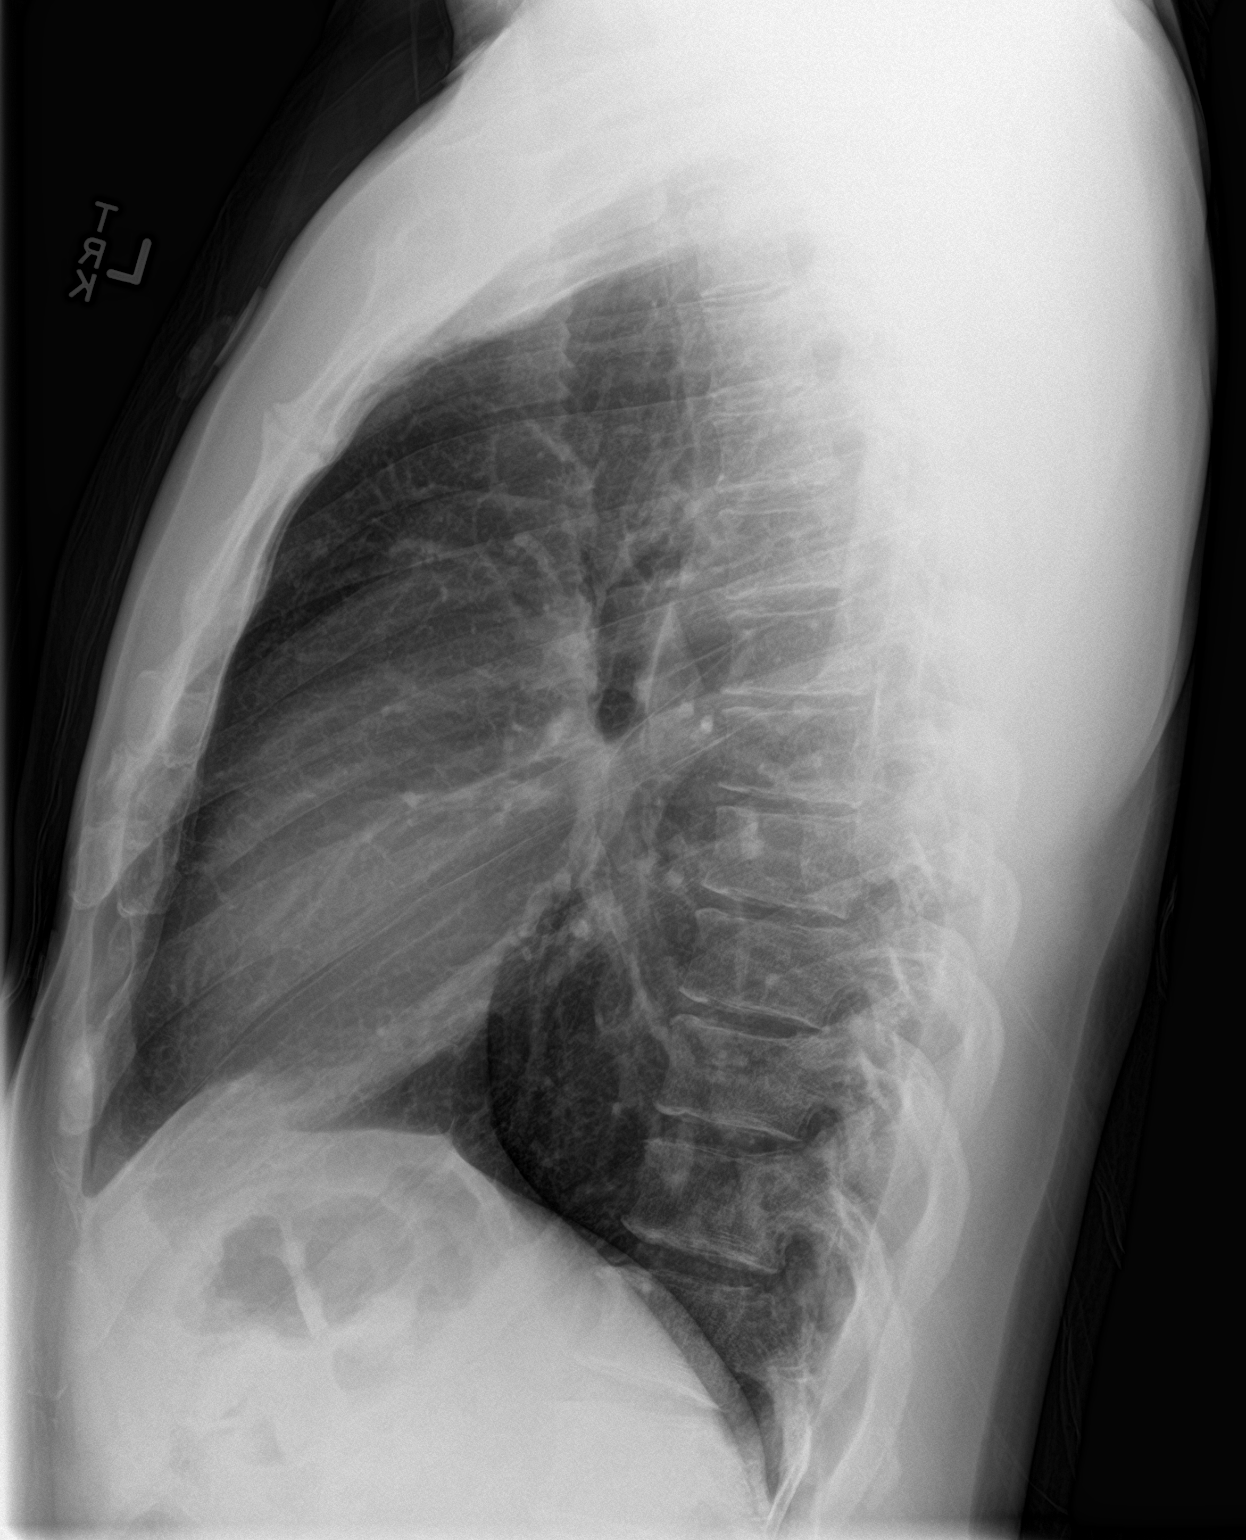

[2 of 2 positions shown; findings below may reference images not displayed]

FINDINGS: The heart size and mediastinal contours are within normal limits.
Both lungs are clear. The visualized skeletal structures are
unremarkable.
IMPRESSION: No active cardiopulmonary disease.

## 2021-09-21 ENCOUNTER — Telehealth: Payer: Self-pay | Admitting: *Deleted

## 2021-09-21 ENCOUNTER — Other Ambulatory Visit: Payer: Self-pay | Admitting: *Deleted

## 2021-09-21 DIAGNOSIS — E785 Hyperlipidemia, unspecified: Secondary | ICD-10-CM

## 2021-09-21 MED ORDER — PRAVASTATIN SODIUM 40 MG PO TABS
40.0000 mg | ORAL_TABLET | Freq: Every evening | ORAL | 5 refills | Status: DC
Start: 2021-09-21 — End: 2021-11-09

## 2021-09-21 NOTE — Telephone Encounter (Signed)
Pt walked in today requesting to get his cholesterol checked. Was put on Crestor for elevated cholesterol and didn't like the way it made him feel (sluggish and drousy). Stopped taking it in July of this year. Dr. Bing Matter recommended trying new medication: Pravastatin 40mg  daily. Will try new medication and come back in 6 weeks for blood work. Pt was agreeable with plan. He is to call if this medication does not agree with him.

## 2021-10-06 DIAGNOSIS — R0789 Other chest pain: Secondary | ICD-10-CM | POA: Diagnosis not present

## 2021-10-06 DIAGNOSIS — M542 Cervicalgia: Secondary | ICD-10-CM | POA: Diagnosis not present

## 2021-10-06 DIAGNOSIS — Z6827 Body mass index (BMI) 27.0-27.9, adult: Secondary | ICD-10-CM | POA: Diagnosis not present

## 2021-10-07 DIAGNOSIS — M4003 Postural kyphosis, cervicothoracic region: Secondary | ICD-10-CM | POA: Diagnosis not present

## 2021-10-07 DIAGNOSIS — M542 Cervicalgia: Secondary | ICD-10-CM | POA: Diagnosis not present

## 2021-10-26 ENCOUNTER — Other Ambulatory Visit: Payer: Self-pay | Admitting: Family Medicine

## 2021-10-26 DIAGNOSIS — M542 Cervicalgia: Secondary | ICD-10-CM

## 2021-11-04 ENCOUNTER — Ambulatory Visit
Admission: RE | Admit: 2021-11-04 | Discharge: 2021-11-04 | Disposition: A | Discharge status: Home / Self Care | Payer: BC Managed Care – PPO | Source: Ambulatory Visit | Attending: Family Medicine | Admitting: Family Medicine

## 2021-11-04 ENCOUNTER — Other Ambulatory Visit: Payer: Self-pay

## 2021-11-04 ENCOUNTER — Other Ambulatory Visit: Payer: Self-pay | Admitting: Family Medicine

## 2021-11-04 DIAGNOSIS — M50322 Other cervical disc degeneration at C5-C6 level: Secondary | ICD-10-CM | POA: Diagnosis not present

## 2021-11-04 DIAGNOSIS — M4802 Spinal stenosis, cervical region: Secondary | ICD-10-CM | POA: Diagnosis not present

## 2021-11-04 DIAGNOSIS — M542 Cervicalgia: Secondary | ICD-10-CM

## 2021-11-04 DIAGNOSIS — M50222 Other cervical disc displacement at C5-C6 level: Secondary | ICD-10-CM | POA: Diagnosis not present

## 2021-11-06 ENCOUNTER — Telehealth: Payer: Self-pay | Admitting: Cardiology

## 2021-11-06 ENCOUNTER — Encounter: Payer: Self-pay | Admitting: Cardiology

## 2021-11-06 NOTE — Telephone Encounter (Signed)
Pt c/o medication issue:  1. Name of Medication: ROSUVASTATIN 10MG   2. How are you currently taking this medication (dosage and times per day)?   3. Are you having a reaction (difficulty breathing--STAT)?   4. What is your medication issue?  PT WOULD LIKE TO GET A PRESCRIPTION FOR THIS MEDICINE. PT ALSO SENT A MY CHART MESSAGE TODAY. PT SAID HE HAVE NEVER TAKEN THE PRAVASTATIN 40MG 

## 2021-11-06 NOTE — Telephone Encounter (Signed)
Spoke to patient. He called in October saying he did not want to continue crestor 10 mg daily. We sent in pravastatin. However he never took pravastatin and started back on his crestor. He wants [place] to send in crestor 10 daily now. Will confirm with Dr. Bing Matter.

## 2021-11-06 NOTE — Telephone Encounter (Signed)
This was addressed over the phone please see encounter.

## 2021-11-09 DIAGNOSIS — M542 Cervicalgia: Secondary | ICD-10-CM | POA: Diagnosis not present

## 2021-11-09 DIAGNOSIS — M47892 Other spondylosis, cervical region: Secondary | ICD-10-CM | POA: Diagnosis not present

## 2021-11-09 DIAGNOSIS — Z6827 Body mass index (BMI) 27.0-27.9, adult: Secondary | ICD-10-CM | POA: Diagnosis not present

## 2021-11-09 DIAGNOSIS — I10 Essential (primary) hypertension: Secondary | ICD-10-CM | POA: Diagnosis not present

## 2021-11-09 MED ORDER — ROSUVASTATIN CALCIUM 10 MG PO TABS
10.0000 mg | ORAL_TABLET | Freq: Every day | ORAL | 1 refills | Status: AC
Start: 2021-11-09 — End: 2022-02-07

## 2021-11-09 NOTE — Telephone Encounter (Signed)
Crestor 10 mg daily sent in. Patient aware.

## 2021-11-14 ENCOUNTER — Other Ambulatory Visit: Payer: BC Managed Care – PPO

## 2021-12-03 DIAGNOSIS — R319 Hematuria, unspecified: Secondary | ICD-10-CM | POA: Diagnosis not present

## 2021-12-11 DIAGNOSIS — K219 Gastro-esophageal reflux disease without esophagitis: Secondary | ICD-10-CM | POA: Diagnosis not present

## 2021-12-11 DIAGNOSIS — R319 Hematuria, unspecified: Secondary | ICD-10-CM | POA: Diagnosis not present

## 2021-12-11 DIAGNOSIS — Z6828 Body mass index (BMI) 28.0-28.9, adult: Secondary | ICD-10-CM | POA: Diagnosis not present

## 2022-01-01 ENCOUNTER — Other Ambulatory Visit: Payer: Self-pay

## 2022-01-04 ENCOUNTER — Other Ambulatory Visit: Payer: Self-pay

## 2022-01-04 ENCOUNTER — Ambulatory Visit (INDEPENDENT_AMBULATORY_CARE_PROVIDER_SITE_OTHER): Payer: BC Managed Care – PPO | Admitting: Cardiology

## 2022-01-04 ENCOUNTER — Encounter: Payer: Self-pay | Admitting: Cardiology

## 2022-01-04 VITALS — BP 120/74 | HR 73 | Ht 70.0 in | Wt 196.8 lb

## 2022-01-04 DIAGNOSIS — R0609 Other forms of dyspnea: Secondary | ICD-10-CM | POA: Diagnosis not present

## 2022-01-04 DIAGNOSIS — E785 Hyperlipidemia, unspecified: Secondary | ICD-10-CM

## 2022-01-04 DIAGNOSIS — R0789 Other chest pain: Secondary | ICD-10-CM | POA: Diagnosis not present

## 2022-01-04 NOTE — Patient Instructions (Signed)
Medication Instructions:  Your physician recommends that you continue on your current medications as directed. Please refer to the Current Medication list given to you today.  *If you need a refill on your cardiac medications before your next appointment, please call your pharmacy*   Lab Work: Your physician recommends that you return for lab work in: Labs today: Direct LDL If you have labs (blood work) drawn today and your tests are completely normal, you will receive your results only by: [place] (if you have MyChart) OR A paper copy in the mail If you have any lab test that is abnormal or we need to change your treatment, we will call you to review the results.   Testing/Procedures: None   Follow-Up: At [facility], you and your health needs are our priority.  As part of our continuing mission to provide you with exceptional heart care, we have created designated Provider Care Teams.  These Care Teams include your primary Cardiologist (physician) and Advanced Practice Providers (APPs -  Physician Assistants and Nurse Practitioners) who all work together to provide you with the care you need, when you need it.  We recommend signing up for the patient portal called "MyChart".  Sign up information is provided on this After Visit Summary.  MyChart is used to connect with patients for Virtual Visits (Telemedicine).  Patients are able to view lab/test results, encounter notes, upcoming appointments, etc.  Non-urgent messages can be sent to your provider as well.   To learn more about what you can do with MyChart, go to [email or web address].    Your next appointment:    Follow up as needed  The format for your next appointment:   In Person  Provider:   Jenne Campus, MD    Other Instructions None

## 2022-01-04 NOTE — Progress Notes (Signed)
Cardiology Office Note:    Date:  01/04/2022   ID:  Chad Bruce., DOB Feb 15, 1976, MRN [id number]  PCP:  Angelina Sheriff, MD  Cardiologist:  Jenne Campus, MD    Referring MD: Angelina Sheriff, MD   Chief Complaint  Patient presents with   Follow-up    Wants lipids check    History of Present Illness:    Chad Bruce. is a 46 y.o. male with past medical history significant for precordial chest pain, so far aggressive work-up has been negative that include coronary CT angio showing calcium score 0 and normal coronary arteries, dyslipidemia, dyspnea on exertion.  He comes today to my office for follow-up.  Overall he is doing very well.  He denies have any chest pain tightness squeezing pressure burning chest.  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  He does not smoke.  Past Medical History:  Diagnosis Date   Atypical chest pain 10/11/2019   Chronic sinusitis 06/27/2019   Deviated nasal septum 06/27/2019   Dyslipidemia 04/24/2020   Dyspnea on exertion 04/24/2020   Gastroesophageal reflux disease 10/11/2019   GERD (gastroesophageal reflux disease)    Prepatellar bursitis, left knee 06/19/2020   Right knee pain 05/19/2015   Screening for viral disease 10/11/2019   Sprain of medial collateral ligament of right knee 06/16/2015    Past Surgical History:  Procedure Laterality Date   MANDIBLE SURGERY     hairline fx    Current Medications: Current Meds  Medication Sig   fluticasone (FLONASE) 50 MCG/ACT nasal spray Place 2 sprays into both nostrils daily.   rosuvastatin (CRESTOR) 10 MG tablet Take 1 tablet (10 mg total) by mouth daily.     Allergies:   Patient has no known allergies.   Social History   Socioeconomic History   Marital status: Single    Spouse name: Not on file   Number of children: 4   Years of education: Not on file   Highest education  level: Not on file  Occupational History   Occupation: Roofing/lead tech  Tobacco Use   Smoking status: Never   Smokeless tobacco: Never  Vaping Use   Vaping Use: Never used  Substance and Sexual Activity   Alcohol use: Not Currently   Drug use: Never   Sexual activity: Not on file  Other Topics Concern   Not on file  Social History Narrative   Not on file   Social Determinants of Health   Financial Resource Strain: Not on file  Food Insecurity: Not on file  Transportation Needs: Not on file  Physical Activity: Not on file  Stress: Not on file  Social Connections: Not on file     Family History: The patient's family history includes Diabetes in his father; Heart disease in his father; Prostate cancer in his father. There is no history of Colon cancer, Esophageal cancer, Rectal cancer, or Stomach cancer. ROS:   Please see the history of present illness.    All 14 point review of systems negative except as described per history of present illness  EKGs/Labs/Other Studies Reviewed:      Recent Labs: 04/21/2021: ALT 38  Recent Lipid Panel    Component Value Date/Time   CHOL 191 04/21/2021 0811   TRIG 101 04/21/2021 0811   HDL 41 04/21/2021 0811   CHOLHDL 4.7 04/21/2021 [id number]  [place] 132 (H) 04/21/2021 [id number]    Physical Exam:    VS:  BP 120/74 (BP Location: Left Arm, Patient Position: Sitting)    Pulse 73    Ht 5\' 10"  (1.778 m)    Wt 196 lb 12.8 oz (89.3 kg)    SpO2 96%    BMI 28.24 kg/m     Wt Readings from Last 3 Encounters:  01/04/22 196 lb 12.8 oz (89.3 kg)  05/21/21 190 lb (86.2 kg)  02/23/21 196 lb (88.9 kg)     GEN:  Well nourished, well developed in no acute distress HEENT: Normal NECK: No JVD; No carotid bruits LYMPHATICS: No lymphadenopathy CARDIAC: RRR, no murmurs, no rubs, no gallops RESPIRATORY:  Clear to auscultation without rales, wheezing or rhonchi  ABDOMEN: Soft, non-tender, non-distended MUSCULOSKELETAL:  No edema; No deformity  SKIN:  Warm and dry LOWER EXTREMITIES: no swelling NEUROLOGIC:  Alert and oriented x 3 PSYCHIATRIC:  Normal affect   ASSESSMENT:    1. Atypical chest pain   2. Dyslipidemia   3. Dyspnea on exertion    PLAN:    In order of problems listed above:  Atypical chest pain, denies having any, work-up negative.  Case risk factors modifications. Dyslipidemia he is taking Crestor which I will continue.  We will check his fasting lipid profile today actually is going to be direct LDL since he had some cookies this morning, I did review his K PN which show me his LDL of 132 and HDL 41 this is from 04/21/2021 Dyspnea on exertion denies having any.   Medication Adjustments/Labs and Tests Ordered: Current medicines are reviewed at length with the patient today.  Concerns regarding medicines are outlined above.  No orders of the defined types were placed in this encounter.  Medication changes: No orders of the defined types were placed in this encounter.   Signed, Park Liter, MD, [facility] 01/04/2022 9:40 AM    [place]

## 2022-01-05 LAB — LDL CHOLESTEROL, DIRECT: LDL Direct: 79 mg/dL (ref 0–99)

## 2022-04-18 IMAGING — DX DG CHEST 2V
2 series · 2 of 2 positions shown · non-contrast
Comparison: 09/24/2019

CLINICAL DATA: Right-sided chest pain

EXAM:
CHEST - 2 VIEW

[chest pa]
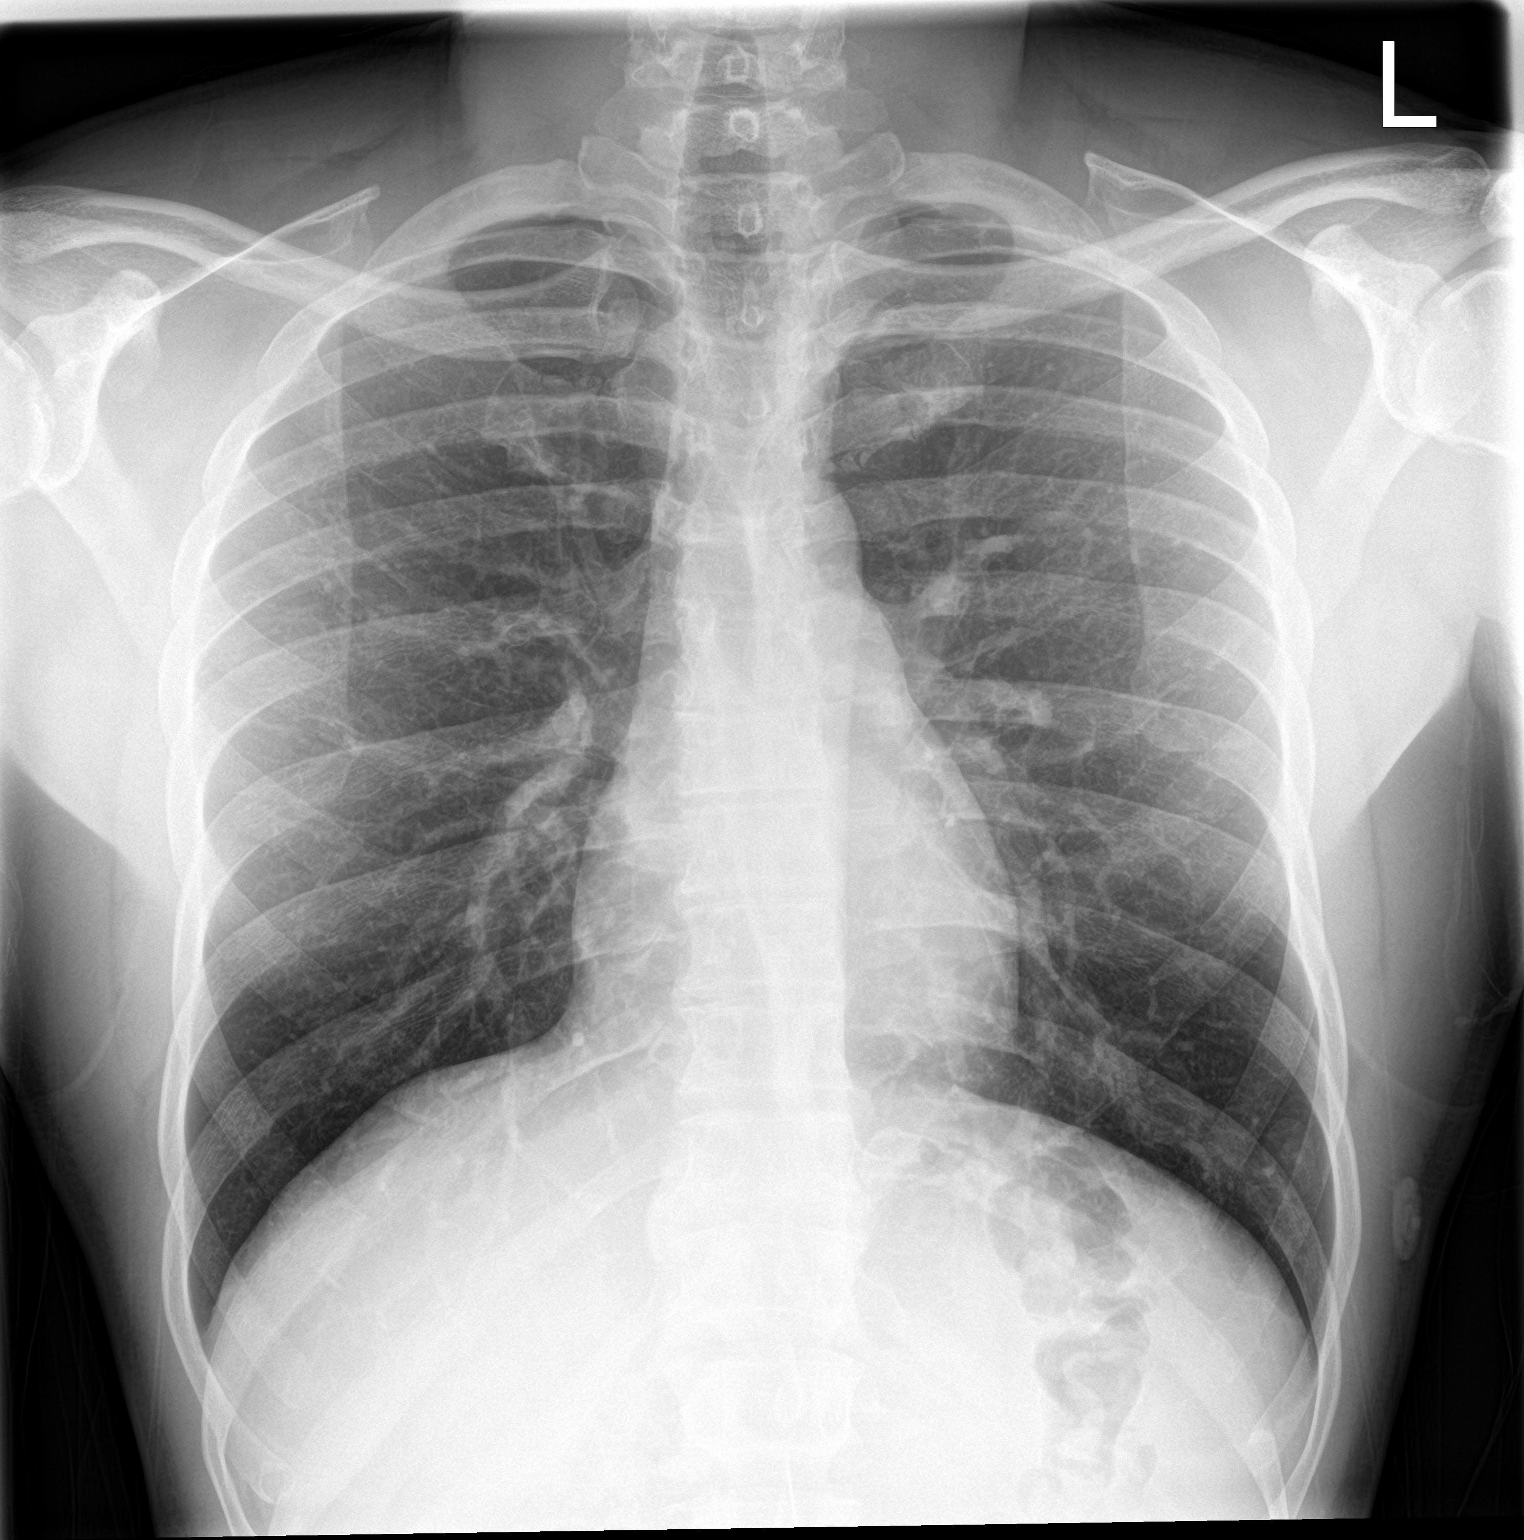

[chest lat]
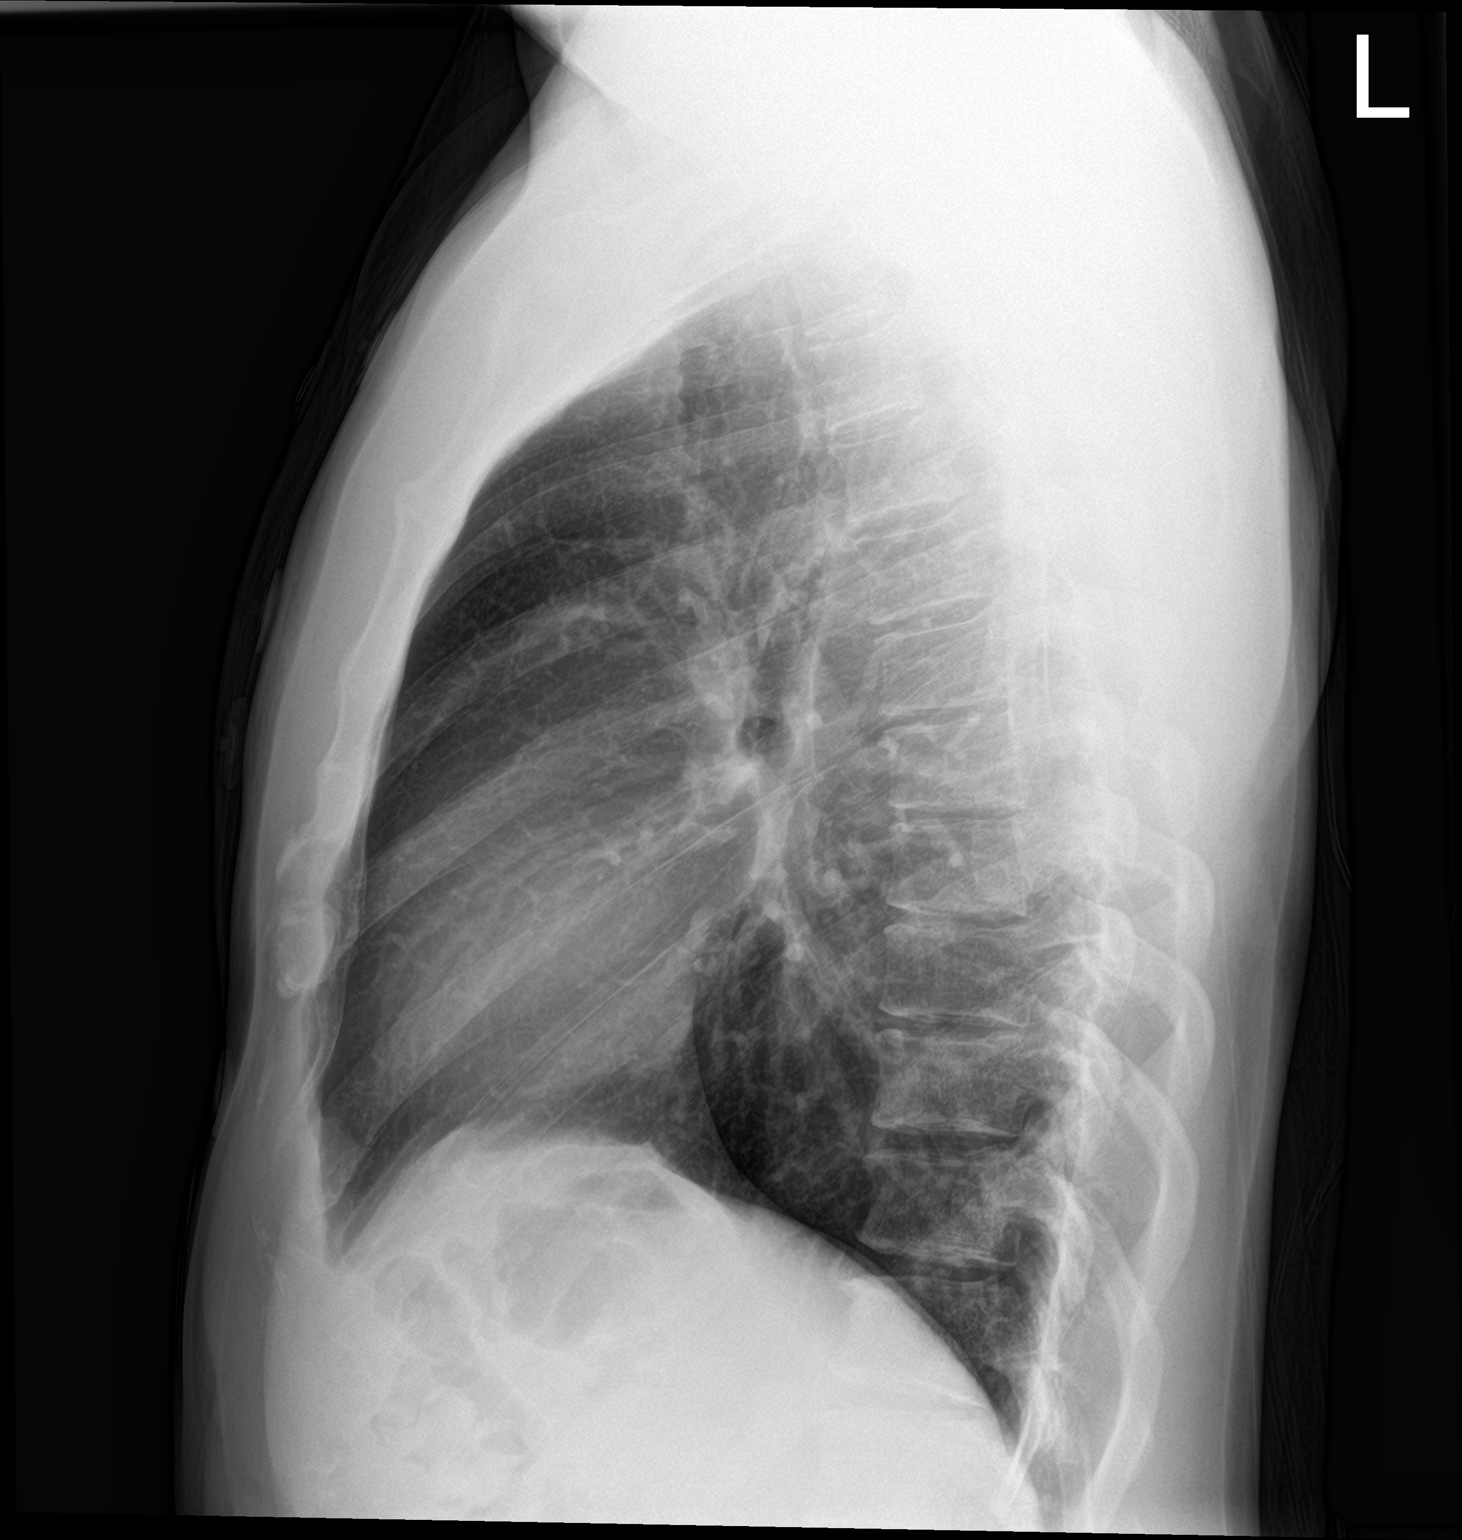

[2 of 2 positions shown; findings below may reference images not displayed]

FINDINGS: The heart size and mediastinal contours are within normal limits.
Both lungs are clear. The visualized skeletal structures are
unremarkable.
IMPRESSION: No active cardiopulmonary disease.

## 2022-06-10 DIAGNOSIS — S66912A Strain of unspecified muscle, fascia and tendon at wrist and hand level, left hand, initial encounter: Secondary | ICD-10-CM | POA: Diagnosis not present

## 2022-06-10 DIAGNOSIS — Z6827 Body mass index (BMI) 27.0-27.9, adult: Secondary | ICD-10-CM | POA: Diagnosis not present

## 2022-07-29 ENCOUNTER — Telehealth: Payer: Self-pay | Admitting: Cardiology

## 2022-07-29 NOTE — Telephone Encounter (Signed)
Error
